# Patient Record
Sex: Male | Born: 1995 | Race: Black or African American | Hispanic: No | Marital: Single | Smoking: Never smoker
Health system: Southern US, Community
[De-identification: ages and names within clinical notes are randomized; demographics above are authoritative.]

---
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
## 2017-08-10 ENCOUNTER — Emergency Department (HOSPITAL_COMMUNITY)
Admission: EM | Admit: 2017-08-10 | Discharge: 2017-08-11 | Disposition: A | Discharge status: Home / Self Care | Payer: Medicaid Other | Attending: Emergency Medicine | Admitting: Emergency Medicine

## 2017-08-10 ENCOUNTER — Emergency Department (HOSPITAL_COMMUNITY): Payer: Medicaid Other

## 2017-08-10 ENCOUNTER — Other Ambulatory Visit: Payer: Self-pay

## 2017-08-10 DIAGNOSIS — F419 Anxiety disorder, unspecified: Secondary | ICD-10-CM | POA: Insufficient documentation

## 2017-08-10 DIAGNOSIS — Z79899 Other long term (current) drug therapy: Secondary | ICD-10-CM | POA: Insufficient documentation

## 2017-08-10 DIAGNOSIS — R0789 Other chest pain: Secondary | ICD-10-CM | POA: Diagnosis not present

## 2017-08-10 DIAGNOSIS — R079 Chest pain, unspecified: Secondary | ICD-10-CM | POA: Diagnosis present

## 2017-08-10 LAB — CBC
HEMATOCRIT: 44.1 % (ref 39.0–52.0)
HEMOGLOBIN: 14.8 g/dL (ref 13.0–17.0)
MCH: 32.5 pg (ref 26.0–34.0)
MCHC: 33.6 g/dL (ref 30.0–36.0)
MCV: 96.9 fL (ref 78.0–100.0)
Platelets: 247 10*3/uL (ref 150–400)
RBC: 4.55 MIL/uL (ref 4.22–5.81)
RDW: 12.3 % (ref 11.5–15.5)
WBC: 9.6 10*3/uL (ref 4.0–10.5)

## 2017-08-10 LAB — URINALYSIS, ROUTINE W REFLEX MICROSCOPIC
Bilirubin Urine: NEGATIVE
GLUCOSE, UA: NEGATIVE mg/dL
Hgb urine dipstick: NEGATIVE
KETONES UR: NEGATIVE mg/dL
LEUKOCYTES UA: NEGATIVE
NITRITE: NEGATIVE
PROTEIN: NEGATIVE mg/dL
Specific Gravity, Urine: 1.028 (ref 1.005–1.030)
pH: 5 (ref 5.0–8.0)

## 2017-08-10 LAB — BASIC METABOLIC PANEL
ANION GAP: 9 (ref 5–15)
BUN: 13 mg/dL (ref 6–20)
CO2: 26 mmol/L (ref 22–32)
Calcium: 9.4 mg/dL (ref 8.9–10.3)
Chloride: 105 mmol/L (ref 101–111)
Creatinine, Ser: 1.26 mg/dL — ABNORMAL HIGH (ref 0.61–1.24)
GLUCOSE: 102 mg/dL — AB (ref 65–99)
Potassium: 3.9 mmol/L (ref 3.5–5.1)
Sodium: 140 mmol/L (ref 135–145)

## 2017-08-10 LAB — I-STAT TROPONIN, ED: TROPONIN I, POC: 0.01 ng/mL (ref 0.00–0.08)

## 2017-08-10 NOTE — ED Triage Notes (Signed)
Patient c/o CP that began one hour ago and states that he believes it may have been a panic attack.

## 2017-08-11 ENCOUNTER — Emergency Department (HOSPITAL_BASED_OUTPATIENT_CLINIC_OR_DEPARTMENT_OTHER): Payer: Medicaid Other

## 2017-08-11 DIAGNOSIS — I361 Nonrheumatic tricuspid (valve) insufficiency: Secondary | ICD-10-CM

## 2017-08-11 LAB — ECHOCARDIOGRAM COMPLETE
Height: 76 in
WEIGHTICAEL: 4816 [oz_av]

## 2017-08-11 LAB — I-STAT TROPONIN, ED: Troponin i, poc: 0 ng/mL (ref 0.00–0.08)

## 2017-08-11 NOTE — Discharge Instructions (Addendum)
It was a pleasure to take care of you Matthew Munoz. During this stay you were taken care of for your chest pain. You did not have a heart attack. Please follow up with your primary care provider within a week

## 2017-08-11 NOTE — ED Notes (Signed)
Patient is resting comfortably. 

## 2017-08-11 NOTE — ED Notes (Signed)
Pt to ECHO with tech

## 2017-08-11 NOTE — Progress Notes (Signed)
  Echocardiogram 2D Echocardiogram has been performed.  Uriel Dowding L Androw 08/11/2017, 10:37 AM

## 2017-08-11 NOTE — ED Notes (Signed)
Pt ambulated to restroom without distress; continues to deny chest pain

## 2017-08-11 NOTE — ED Provider Notes (Addendum)
[facility] EMERGENCY DEPARTMENT Provider Note   CSN: [id number] Arrival date & time: 08/10/17  2000     History   Chief Complaint Chief Complaint  Patient presents with  . Chest Pain    HPI Matthew Munoz is a 22 y.o. male. With mitral regurgitation presents with one episode of chest pain that started yesterday evening around 6:30pm and lasted a few seconds. The patient describes his chest pain to be present over anterior chest, dull in nature, and non-radiating. The patient states that he has accompanied shortness of breath, subjective tachycardia, and a feeling of impending sense of doom. Prior to the episode of chest pain the patient had worked out (lifted [employer]) and subsequently had eaten a meal and was laying on his bed playing a video game. He stays with a roommate in apartment and he asked his roommate to call ambulance.    He denies any sweating, dizziness, nausea/vomiting, or palpitations. The patient states that he felt this way once before a year ago.   The patient's great grandfather and his uncle both had heart disease in their 69s-60s.  No past medical history on file.  There are no active problems to display for this patient.    Home Medications    Prior to Admission medications   Medication Sig Start Date End Date Taking? Authorizing Provider  lisinopril (PRINIVIL,ZESTRIL) 5 MG tablet Take 5 mg by mouth at bedtime.   Yes [provider]    Family History No family history on file.  Social History Social History   Tobacco Use  . Smoking status: Not on file  Substance Use Topics  . Alcohol use: Not on file  . Drug use: Not on file     Allergies   Avocado   Review of Systems Review of Systems  Cardiovascular: Negative for palpitations.  Gastrointestinal: Negative for abdominal pain, nausea and vomiting.     Physical Exam Updated Vital Signs BP 124/88   Pulse (!) 56   Temp 98.4 F (36.9 C) (Oral)   Resp 17   Ht   (1.93 m)   Wt (!) 136.5 kg (301 lb)   SpO2 100%   BMI 36.64 kg/m   Physical Exam  Constitutional: He appears well-developed and well-nourished.  Non-toxic appearance. He does not appear ill.  HENT:  Head: Normocephalic and atraumatic.  Eyes: Pupils are equal, round, and reactive to light.  Cardiovascular: Normal rate and regular rhythm.  Pulmonary/Chest: Effort normal and breath sounds normal. No accessory muscle usage. No respiratory distress.  Abdominal: Soft. Bowel sounds are normal. He exhibits no distension. There is no tenderness.  Musculoskeletal:       Right lower leg: He exhibits no edema.  Neurological: He is alert.  Skin: No erythema.  Psychiatric: He has a normal mood and affect. His behavior is normal. He is not agitated.     ED Treatments / Results  Labs (all labs ordered are listed, but only abnormal results are displayed) Labs Reviewed  BASIC METABOLIC PANEL - Abnormal; Notable for the following components:      Result Value   Glucose, Bld 102 (*)    Creatinine, Ser 1.26 (*)    All other components within normal limits  URINALYSIS, ROUTINE W REFLEX MICROSCOPIC - Abnormal; Notable for the following components:   APPearance HAZY (*)    All other components within normal limits  CBC  I-STAT TROPONIN, ED  I-STAT TROPONIN, ED    EKG EKG Interpretation  Date/Time:  Tuesday Aug 11 2017 08:16:18 EDT Ventricular Rate:  59 PR Interval:  158 QRS Duration: 111 QT Interval:  422 QTC Calculation: 418 R Axis:   29 Text Interpretation:  Sinus rhythm Borderline Q waves in lateral leads Minimal ST depression, inferior leads Borderline ST elevation, anterolateral leads Nonspecific ST and T wave abnormality Nonspecific T wave abnormality Confirmed by Derwood Kaplan [phone]) on 08/11/2017 12:11:58 PM   Radiology Dg Chest 2 View  Result Date: 08/10/2017 CLINICAL DATA:  22 year old male with chest pain EXAM: CHEST - 2 VIEW COMPARISON:  None. FINDINGS: The heart size  and mediastinal contours are within normal limits. Both lungs are clear. The visualized skeletal structures are unremarkable. IMPRESSION: No active cardiopulmonary disease. Electronically Signed   By: Elgie Collard M.D.   On: 08/10/2017 21:35    Procedures Procedures (including critical care time)  Medications Ordered in ED Medications - No data to display   Initial Impression / Assessment and Plan / ED Course  I have reviewed the triage vital signs and the nursing notes.  Pertinent labs & imaging results that were available during my care of the patient were reviewed by me and considered in my medical decision making (see chart for details).  Chest pain The patient had an acute episode of chest pain that lasted a few seconds. EKG showed t wave inversions in V3 and V4, st elevation in V2, and q waves. There was no old EKG to compare. Chest xray without any acute cardiopulmonary changes. The patient is afebrile, without leukocytosis, and any accompanying upper respiratory symptoms to suggest pneumonia. The patient also has normal hemoglobin and hematocrit (14.8 and 44.1 respectively) to suggest iron deficiency anemia. His extremities are symmetric and no other historical factors to consider dvt or pe. Wells score is low.    Patient has a heart score of 1 based on obesity. Called cardiology who reviewed EKG and felt that it was early repolarization. Echocardiogram revealed preserved EF 50-55%, normal wall motion, calcified mitral valve, no pericardial effusion, mildly elevated PA pressure at .  Spoke to cardiologist Dr. Armanda Magic who mentioned that the EKG changes are consistent with early repolarization. Was able to obtain prior ekg from [facility] and ekg changes seem unchanged. Recommended follow up with primary care provider.   The patient may need to be started on long term medication for anxiety.   Final Clinical Impressions(s) / ED Diagnoses   Final diagnoses:    Atypical chest pain  Anxiety    ED Discharge Orders    None       Lorenso Courier, MD 08/11/17 1343    Lorenso Courier, MD 08/11/17 1355    Lorenso Courier, MD 08/11/17 1416    Loren Racer, MD 08/13/17 1650

## 2020-08-31 ENCOUNTER — Other Ambulatory Visit: Payer: Self-pay

## 2020-08-31 ENCOUNTER — Emergency Department (HOSPITAL_COMMUNITY)
Admission: EM | Admit: 2020-08-31 | Discharge: 2020-09-01 | Disposition: A | Discharge status: Other Acute Inpatient Hospital | Payer: Medicaid Other | Attending: Emergency Medicine | Admitting: Emergency Medicine

## 2020-08-31 ENCOUNTER — Encounter (HOSPITAL_COMMUNITY): Payer: Self-pay | Admitting: Emergency Medicine

## 2020-08-31 DIAGNOSIS — Y900 Blood alcohol level of less than 20 mg/100 ml: Secondary | ICD-10-CM | POA: Insufficient documentation

## 2020-08-31 DIAGNOSIS — F22 Delusional disorders: Secondary | ICD-10-CM | POA: Insufficient documentation

## 2020-08-31 DIAGNOSIS — F1210 Cannabis abuse, uncomplicated: Secondary | ICD-10-CM | POA: Insufficient documentation

## 2020-08-31 DIAGNOSIS — Z20822 Contact with and (suspected) exposure to covid-19: Secondary | ICD-10-CM | POA: Insufficient documentation

## 2020-08-31 NOTE — ED Provider Notes (Signed)
Emergency Medicine Provider Triage Evaluation Note  Matthew Munoz , a 25 y.o. male  was evaluated in triage.  Patient was IVC by family.   Per IVC paperwork, patient has a history of mental health, but is paranoid and believes someone is after him and trying to kill him.  He believes family is against him and trying to kill him.  He stripped down and went outside and tried to go to the park and stripped down.  Family endorses previous heavy marijuana use, but he says that he has stopped.  He sits and laughs.  He talks to himself.  He denies HI, SI, or auditory visual hallucinations.  He states that he has been worried about people trying to harm him recently.  He states that he knows all of these individuals, and he thinks that they are trying to harm him over witchcraft spells.  He denies alcohol use, tobacco, marijuana use.  No other illicit or recreational substance use.  No chronic medical conditions or daily medications.  No fever, chills, chest pain, shortness of breath, dysuria, headache.   Review of Systems  Positive: Delusions, erratic behavior Negative: SI, homicidal ideation, auditory or visual hallucinations, chest pain, shortness of breath, abdominal pain, fever, numbness, weakness, headache, dysuria  Physical Exam  BP (!) 148/96 (BP Location: Right Arm)   Pulse 64   Temp 98.3 F (36.8 C) (Oral)   Resp 16   SpO2 100%  Gen:   Awake, no distress    Resp:  Normal effort  MSK:   Moves extremities without difficulty  Other:  Cooperative.  Answers questions appropriately.  Poor judgment.  Medical Decision Making  Medically screening exam initiated at 11:46 PM.  Appropriate orders placed.  Wendi Snipes was informed that the remainder of the evaluation will be completed by another provider, this initial triage assessment does not replace that evaluation, and the importance of remaining in the ED until their evaluation is complete.  25 year old male who presents under IVC.   First exam has not been completed at this time.  Family is concerned for increasing paranoid behavior.  On exam, patient states that known individuals are trying to harm him of her witchcraft spells.  He has no other medical complaints at this time.  Medical screening labs have been ordered.  TTS consult has been placed.  Patient require further work-up and evaluation in the emergency department.   Barkley Boards, PA-C 08/31/20 2349    Shon Baton, MD 09/01/20 [phone]

## 2020-08-31 NOTE — ED Triage Notes (Signed)
Patient arrived with 2 [employer] officers IVC due to paranoid behavior/erratic behavior  , delusions , talks to self with flight of ideas . Denies SI or HI .

## 2020-08-31 NOTE — ED Notes (Signed)
Pt changed into burgundy scrubs and all belongings were placed in a belongings bag. Pt is calm, cooperative, and understanding of the process.

## 2020-09-01 ENCOUNTER — Ambulatory Visit (HOSPITAL_COMMUNITY)
Admission: EM | Admit: 2020-09-01 | Discharge: 2020-09-01 | Disposition: A | Discharge status: Home / Self Care | Payer: No Payment, Other | Attending: Family | Admitting: Family

## 2020-09-01 DIAGNOSIS — F22 Delusional disorders: Secondary | ICD-10-CM | POA: Insufficient documentation

## 2020-09-01 LAB — COMPREHENSIVE METABOLIC PANEL
ALT: 19 U/L (ref 0–44)
AST: 20 U/L (ref 15–41)
Albumin: 4.3 g/dL (ref 3.5–5.0)
Alkaline Phosphatase: 47 U/L (ref 38–126)
Anion gap: 6 (ref 5–15)
BUN: 12 mg/dL (ref 6–20)
CO2: 29 mmol/L (ref 22–32)
Calcium: 9.5 mg/dL (ref 8.9–10.3)
Chloride: 106 mmol/L (ref 98–111)
Creatinine, Ser: 1.23 mg/dL (ref 0.61–1.24)
GFR, Estimated: >60 mL/min (ref >60)
Glucose, Bld: 88 mg/dL (ref 70–99)
Potassium: 4.1 mmol/L (ref 3.5–5.1)
Sodium: 141 mmol/L (ref 135–145)
Total Bilirubin: 1.7 mg/dL — ABNORMAL HIGH (ref 0.3–1.2)
Total Protein: 7 g/dL (ref 6.5–8.1)

## 2020-09-01 LAB — CBC WITH DIFFERENTIAL/PLATELET
Abs Immature Granulocytes: 0.02 10*3/uL (ref 0.00–0.07)
Basophils Absolute: 0.1 10*3/uL (ref 0.0–0.1)
Basophils Relative: 1 %
Eosinophils Absolute: 0.3 10*3/uL (ref 0.0–0.5)
Eosinophils Relative: 4 %
HCT: 49.7 % (ref 39.0–52.0)
Hemoglobin: 16.5 g/dL (ref 13.0–17.0)
Immature Granulocytes: 0 %
Lymphocytes Relative: 32 %
Lymphs Abs: 2.4 10*3/uL (ref 0.7–4.0)
MCH: 32.9 pg (ref 26.0–34.0)
MCHC: 33.2 g/dL (ref 30.0–36.0)
MCV: 99 fL (ref 80.0–100.0)
Monocytes Absolute: 0.5 10*3/uL (ref 0.1–1.0)
Monocytes Relative: 7 %
Neutro Abs: 4.4 10*3/uL (ref 1.7–7.7)
Neutrophils Relative %: 56 %
Platelets: 240 10*3/uL (ref 150–400)
RBC: 5.02 MIL/uL (ref 4.22–5.81)
RDW: 11.4 % — ABNORMAL LOW (ref 11.5–15.5)
WBC: 7.7 10*3/uL (ref 4.0–10.5)
nRBC: 0 % (ref 0.0–0.2)

## 2020-09-01 LAB — RESP PANEL BY RT-PCR (FLU A&B, COVID) ARPGX2
Influenza A by PCR: NEGATIVE
Influenza B by PCR: NEGATIVE
SARS Coronavirus 2 by RT PCR: NEGATIVE

## 2020-09-01 LAB — RAPID URINE DRUG SCREEN, HOSP PERFORMED
Amphetamines: NOT DETECTED
Barbiturates: NOT DETECTED
Benzodiazepines: NOT DETECTED
Cocaine: NOT DETECTED
Opiates: NOT DETECTED
Tetrahydrocannabinol: NOT DETECTED

## 2020-09-01 LAB — ETHANOL: Alcohol, Ethyl (B): <10 mg/dL (ref <10)

## 2020-09-01 LAB — ACETAMINOPHEN LEVEL: Acetaminophen (Tylenol), Serum: <10 ug/mL — ABNORMAL LOW (ref 10–30)

## 2020-09-01 LAB — SALICYLATE LEVEL: Salicylate Lvl: <7 mg/dL — ABNORMAL LOW (ref 7.0–30.0)

## 2020-09-01 MED ORDER — ACETAMINOPHEN 325 MG PO TABS: 650.0000 mg | ORAL_TABLET | Freq: Four times a day (QID) | ORAL | Status: DC | PRN

## 2020-09-01 MED ORDER — ALUM & MAG HYDROXIDE-SIMETH 200-200-20 MG/5ML PO SUSP: 30.0000 mL | ORAL | Status: DC | PRN

## 2020-09-01 MED ORDER — MAGNESIUM HYDROXIDE 400 MG/5ML PO SUSP: 30.0000 mL | Freq: Every day | ORAL | Status: DC | PRN

## 2020-09-01 NOTE — ED Provider Notes (Signed)
[facility] Medical Screening Exam  Patient Name: Matthew Munoz MRN: [id number] Date of Evaluation: 09/01/20 Chief Complaint:   Diagnosis:  Final diagnoses:  Paranoia (HCC)    History of Present illness: Matthew Munoz is a 25 y.o. male was place under involuntary commitment for reported paranoia ideations.  NP spoke to patient's mother with [name] about reported symptoms.  She reports patient has be desplying bizarre behavior and more recently his paranoia is more pronounced. Reported patient has been hiding in the closet thinking people are after him.stated a family history with mental illiness  reports patient's sister recently diagnosed with schizoaffective/schizophrenia.  Maternal grandmother has mental health diagnoses.   On evaluation.  Patient is denying paranoia, delusions or auditory hallucinations.  Denies suicidal or homicidal ideations.  Chart reviewed negative UDS.  Discussed initiating antipsychotics.  Patient declined and is requesting to discharge.  Case staffed with attending psychiatrist Akintayo.  Will rescind involuntary commitment.  Support, encouragement and reassurance was provided.   Per admission assessment note: Matthew Munoz , a 25 y.o. male  was evaluated in triage.  Patient was IVC by family.   Per IVC paperwork, patient has a history of mental health, but is paranoid and believes someone is after him and trying to kill him.  He believes family is against him and trying to kill him.  He stripped down and went outside and tried to go to the park and stripped down.  Family endorses previous heavy marijuana use, but he says that he has stopped.  He sits and laughs.  He talks to himself.  Psychiatric Specialty Exam  Presentation  General Appearance:Appropriate for Environment  Eye Contact:Good  Speech:Clear and Coherent  Speech Volume:Normal  Handedness:Right   Mood and Affect  Mood:Irritable  Affect:Congruent   Thought  Process  Thought Processes:Coherent  Descriptions of Associations:Intact  Orientation:Full (Time, Place and Person)  Thought Content:Logical  Diagnosis of Schizophrenia or Schizoaffective disorder in past: No  Duration of Psychotic Symptoms: Less than six months  Hallucinations:None  Ideas of Reference:None  Suicidal Thoughts:No  Homicidal Thoughts:No   Sensorium  Memory:Immediate Fair; Recent Fair  Judgment:Fair  Insight:Good   Executive Functions  Concentration:Fair  Attention Span:Fair  Recall:Good  Fund of Knowledge:Good  Language:Good   Psychomotor Activity  Psychomotor Activity:Normal   Assets  Assets:Intimacy; Financial Resources/Insurance; Social Support   Sleep  Sleep:Fair  Number of hours: No data recorded  Nutritional Assessment (For [facility] and [facility] admissions only) Has the patient had a weight loss or gain of 10 pounds or more in the last 3 months?: No Has the patient had a decrease in food intake/or appetite?: No Does the patient have dental problems?: No Does the patient have eating habits or behaviors that may be indicators of an eating disorder including binging or inducing vomiting?: No Has the patient recently lost weight without trying?: No Has the patient been eating poorly because of a decreased appetite?: No Malnutrition Screening Tool Score: 0    Physical Exam: Physical Exam Cardiovascular:     Rate and Rhythm: Normal rate and regular rhythm.  Neurological:     Mental Status: He is alert.  Psychiatric:        Mood and Affect: Mood normal.        Thought Content: Thought content normal.    Review of Systems  Psychiatric/Behavioral: Negative for depression and suicidal ideas. The patient is not nervous/anxious.   All other systems reviewed and are negative.  Blood pressure  133/89, pulse (!) 50, SpO2 100 %. There is no height or weight on file to calculate BMI.  Musculoskeletal: Strength & Muscle Tone: within normal  limits Gait & Station: normal Patient leans: N/A   [facility] MSE Discharge Disposition for Follow up and Recommendations: Based on my evaluation the patient does not appear to have an emergency medical condition and can be discharged with resources and follow up care in outpatient services for Medication Management, Partial Hospitalization Program and Individual Therapy   Oneta Rack, NP 09/01/2020, 12:20 PM

## 2020-09-01 NOTE — Progress Notes (Signed)
Patient oriented to [facility] unit, offered food and toileting, denies SI, HI and AVH. No skin issues present on body. Patient minimally interactive/ cautious when speaking, answering yes or no question appropriately. Nursing staff will continue to monitor.

## 2020-09-01 NOTE — Progress Notes (Signed)
Patient told, mom would be here in an hour to pick him up. Patient upset about waiting for an hour.

## 2020-09-01 NOTE — ED Notes (Signed)
Pt belongings in locker 2 and also with security

## 2020-09-01 NOTE — Progress Notes (Signed)
Patient received after visit summary with follow up instructions. Patient escorted to the lobby where he can wait for his mother or walk home as he wanted.

## 2020-09-01 NOTE — BH Assessment (Signed)
Comprehensive Clinical Assessment (CCA) Note  09/01/2020 Matthew Munoz [id number] Recommendations for Services/Supports/Treatments: Consulted with Ysidro Evert B., NP who recommended pt. be observed at [facility] and reassessed in the AM. Notified pt's nurse Bonita Quin of disposition.   Pt presented with a casual appearance. Pt's speech was clear and coherent. Motor behavior appears unremarkable. Pt was alert and oriented x4. Pt's thought content was relevant to the context of the situation. Eye contact was poor. Pt's mood is labile, affect is congruent with mood. The pt was initially calm. It was noted that the pt. became slightly agitated when asked if he was suicidal or hearing voices. Towards the end of the assessment the pt. was smiling inappropriately. Patient was noted to have poor insight and was unable to identify any services needed. Pt admitted to feeling like someone was after him prior to coming to the ED; however, pt denied currently feeling that way. Pt reports that his main stressors are not having enough money and feeling like someone was after him. Pt denies substance use. The patient denied SI, HI, AV/hallucinations. Pt was guarded and expressed that the best thing to help him would be to discharge. Flowsheet Row ED from 08/31/2020 in [facility] EMERGENCY DEPARTMENT  C-SSRS RISK CATEGORY No Risk    The patient demonstrates the following risk factors for suicide: Chronic risk factors for suicide include: N/A. Acute risk factors for suicide include: N/A. Protective factors for this patient include: hope for the future. Considering these factors, the overall suicide risk at this point appears to be no risk. Patient is not appropriate for outpatient follow up.  Therefore; pt is not recommended for 1:1 suicide precautions.  Chief Complaint:  Chief Complaint  Patient presents with  . IVC ( Paranoid/Delusions)    Visit Diagnosis: Psych evaluation    CCA Screening, Triage and  Referral (STR)  Patient Reported Information How did you hear about [place]? Family/Friend  Referral name: No data recorded Referral phone number: No data recorded  Whom do you see for routine medical problems? No data recorded Practice/Facility Name: No data recorded Practice/Facility Phone Number: No data recorded Name of Contact: No data recorded Contact Number: No data recorded Contact Fax Number: No data recorded Prescriber Name: No data recorded Prescriber Address (if known): No data recorded  What Is the Reason for Your Visit/Call Today? Paranoid Delusions  How Long Has This Been Causing You Problems? 1 wk - 1 month  What Do You Feel Would Help You the Most Today? -- (Pt unable to identify needs at this time)   Have You Recently Been in Any Inpatient Treatment (Hospital/Detox/Crisis Center/28-Day Program)? No data recorded Name/Location of Program/Hospital:No data recorded How Long Were You There? No data recorded When Were You Discharged? No data recorded  Have You Ever Received Services From [facility] Before? No data recorded Who Do You See at [facility]? No data recorded  Have You Recently Had Any Thoughts About Hurting Yourself? No  Are You Planning to Commit Suicide/Harm Yourself At This time? No   Have you Recently Had Thoughts About Hurting Someone Karolee Ohs? No  Explanation: No data recorded  Have You Used Any Alcohol or Drugs in the Past 24 Hours? No  How Long Ago Did You Use Drugs or Alcohol? No data recorded What Did You Use and How Much? No data recorded  Do You Currently Have a Therapist/Psychiatrist? No  Name of Therapist/Psychiatrist: No data recorded  Have You Been Recently Discharged From Any Office Practice  or Programs? No  Explanation of Discharge From Practice/Program: No data recorded    CCA Screening Triage Referral Assessment Type of Contact: Tele-Assessment  Is this Initial or Reassessment? Initial Assessment  Date Telepsych consult  ordered in [facility]:  08/31/2020  Time Telepsych consult ordered in [facility]:  2346   Patient Reported Information Reviewed? Yes  Patient Left Without Being Seen? No data recorded Reason for Not Completing Assessment: No data recorded  Collateral Involvement: No data recorded  Does Patient Have a Court Appointed Legal Guardian? No data recorded Name and Contact of Legal Guardian: No data recorded If Minor and Not Living with Parent(s), Who has Custody? No data recorded Is CPS involved or ever been involved? Never  Is APS involved or ever been involved? Never   Patient Determined To Be At Risk for Harm To Self or Others Based on Review of Patient Reported Information or Presenting Complaint? No  Method: No data recorded Availability of Means: No data recorded Intent: No data recorded Notification Required: No data recorded Additional Information for Danger to Others Potential: No data recorded Additional Comments for Danger to Others Potential: No data recorded Are There Guns or Other Weapons in Your Home? No data recorded Types of Guns/Weapons: No data recorded Are These Weapons Safely Secured?                            No data recorded Who Could Verify You Are Able To Have These Secured: No data recorded Do You Have any Outstanding Charges, Pending Court Dates, Parole/Probation? No data recorded Contacted To Inform of Risk of Harm To Self or Others: No data recorded  Location of Assessment: [facility] ED   Does Patient Present under Involuntary Commitment? Yes  IVC Papers Initial File Date: 08/31/2020   [place] of Residence: [place]   Patient Currently Receiving the Following Services: No data recorded  Determination of Need: Urgent (48 hours)   Options For Referral: -- (Per Ysidro Evert, NP pt is recommended for observation and reassessment at [facility].)     CCA Biopsychosocial Intake/Chief Complaint:  IVC; paranoid delusions, responding to internal stimuli  Current Symptoms/Problems:  "I was mad. I felt like people was after me"   Patient Reported Schizophrenia/Schizoaffective Diagnosis in Past: No   Strengths: Hope  Preferences: None noted  Abilities: Attends to activities of daily living   Type of Services Patient Feels are Needed: Pt unable to identify any services needed.   Initial Clinical Notes/Concerns: No data recorded  Mental Health Symptoms Depression:  None   Duration of Depressive symptoms: No data recorded  Mania:  None   Anxiety:   None   Psychosis:  Delusions   Duration of Psychotic symptoms: Less than six months   Trauma:  None   Obsessions:  None   Compulsions:  None   Inattention:  None   Hyperactivity/Impulsivity:  N/A   Oppositional/Defiant Behaviors:  None   Emotional Irregularity:  None   Other Mood/Personality Symptoms:  No data recorded   Mental Status Exam Appearance and self-care  Stature:  Average   Weight:  Average weight   Clothing:  Casual   Grooming:  Normal   Cosmetic use:  None   Posture/gait:  Normal   Motor activity:  Not Remarkable   Sensorium  Attention:  Normal   Concentration:  No data recorded  Orientation:  No data recorded  Recall/memory:  Normal   Affect and Mood  Affect:  Labile  Mood:  Irritable   Relating  Eye contact:  None   Facial expression:  Responsive   Attitude toward examiner:  Guarded   Thought and Language  Speech flow: Clear and Coherent   Thought content:  Appropriate to Mood and Circumstances   Preoccupation:  None   Hallucinations:  None   Organization:  No data recorded  [employer] of Knowledge:  Average   Intelligence:  Average   Abstraction:  Normal   Judgement:  Impaired   Reality Testing:  Distorted   Insight:  Poor   Decision Making:  Vacilates   Social Functioning  Social Maturity:  Isolates   Social Judgement:  Impropriety   Stress  Stressors:  Surveyor, quantity; Family conflict   Coping Ability:  Deficient  supports   Skill Deficits:  Decision making   Supports:  Support needed     Religion: Religion/Spirituality Are You A Religious Person?: No  Leisure/Recreation:    Exercise/Diet: Exercise/Diet Have You Gained or Lost A Significant Amount of Weight in the Past Six Months?: No Do You Follow a Special Diet?: No Do You Have Any Trouble Sleeping?: No   CCA Employment/Education Employment/Work Situation: Employment / Work Situation Employment situation: Employed 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 Patient's job has been impacted by current illness: No Has patient ever been in the [employer]?: No  Education: Education Is Patient Currently Attending School?: No Last Grade Completed: 12 Did Garment/textile technologist From [employer]?: Yes Did Theme park manager?: No   CCA Family/Childhood History Family and Relationship History: Family history Marital status: Single Are you sexually active?: Yes What is your sexual orientation?: Heterosexual Does patient have children?: No  Childhood History:  Childhood History By whom was/is the patient raised?: Both parents,Grandparents Additional childhood history information: Pt describes childhood as fair Description of patient's relationship with caregiver when they were a child: Pt reported having a strained relationship with his mother growin up. Patient's description of current relationship with people who raised him/her: Pt explained that the relationship is still strained. Does patient have siblings?: Yes Number of Siblings: 7 Description of patient's current relationship with siblings: "My family is fucked up" Did patient suffer any verbal/emotional/physical/sexual abuse as a child?: No Did patient suffer from severe childhood neglect?: No Has patient ever been sexually abused/assaulted/raped as an adolescent or adult?: No Was the patient ever a victim of a crime or a disaster?: No Witnessed domestic violence?:  No Has patient been affected by domestic violence as an adult?: No  Child/Adolescent Assessment:     CCA Substance Use Alcohol/Drug Use: Alcohol / Drug Use Pain Medications: See MAR Prescriptions: See MAR Over the Counter: See MAR History of alcohol / drug use?: No history of alcohol / drug abuse                        Patient Centered Plan: [employer], LCAS

## 2020-09-01 NOTE — ED Notes (Signed)
Report given to [facility] at this time. Report given to [facility]. [employer] notified to transport pt to [facility] ASAP. Waiting for [employer].

## 2020-09-01 NOTE — BH Assessment (Signed)
This Clinical research associate notified pt's nurse Bonita Quin that pt has been accepted to [facility] (Pt can be transported after 7:30 AM; after shift change).

## 2020-09-01 NOTE — ED Notes (Signed)
OFFERED LUNCH. REFUSED

## 2020-09-01 NOTE — Discharge Instructions (Addendum)
Take all medications as prescribed. Keep all follow-up appointments as scheduled.  Do not consume alcohol or use illegal drugs while on prescription medications. Report any adverse effects from your medications to your primary care provider promptly.  In the event of recurrent symptoms or worsening symptoms, call 911, a crisis hotline, or go to the nearest emergency department for evaluation.   

## 2020-09-01 NOTE — ED Notes (Signed)
Pharmacy tech at bedside 

## 2020-09-01 NOTE — ED Notes (Signed)
TTS machine is at bedside with pt at this time

## 2020-09-01 NOTE — ED Provider Notes (Signed)
Patient has been accepted to [facility], IVC in place. Patient will be transported by [employer]. Stable at time of discharge.   Rozelle Logan, DO 09/01/20 2956

## 2020-09-01 NOTE — ED Notes (Signed)
Pt locked belongings and 1 valuable locked in security given to [employer]. 2 [employer] escorted pt to [facility] at this time.

## 2020-09-01 NOTE — ED Notes (Signed)
Attempted to get pt to sign consent for transfer and pt refused

## 2020-09-03 ENCOUNTER — Telehealth (HOSPITAL_COMMUNITY): Payer: Self-pay

## 2020-09-03 NOTE — BH Assessment (Signed)
Care Management - Follow Up [facility] Discharges   Writer attempted to make contact with patient today and was unsuccessful.  Patient voicemail was full.

## 2020-09-25 NOTE — ED Provider Notes (Signed)
[facility] EMERGENCY DEPARTMENT Provider Note   CSN: [id number] Arrival date & time: 08/31/20  2247     History Chief Complaint  Patient presents with  . IVC ( Paranoid/Delusions)     Matthew Munoz is a 25 y.o. male who presents to the emergency department under IVC.  Patient was IVC by family.  Per IVC paperwork, "patient has a history of mental health, but is paranoid and believes someone is after him and trying to kill him.  He believes family is against him and trying to kill him.  He stripped down and went outside and tried to go to the park and stripped down.  Family endorses previous heavy marijuana use, but he says that he has stopped.  He sits and laughs.  He talks to himself."   He denies HI, SI, or auditory visual hallucinations.  He states that he has been worried about people trying to harm him recently.  He states that he knows all of these individuals, and he thinks that they are trying to harm him over witchcraft spells.   He denies alcohol use, tobacco, marijuana use.  No other illicit or recreational substance use.   No chronic medical conditions or daily medications.  No fever, chills, chest pain, shortness of breath, dysuria, headache, or other associated symptoms.  The history is provided by the patient and medical records. No language interpreter was used.      History reviewed. No pertinent past medical history.  There are no problems to display for this patient.   History reviewed. No pertinent surgical history.     No family history on file.  Social History   Tobacco Use  . Smoking status: Never  . Smokeless tobacco: Never  Substance Use Topics  . Alcohol use: Never  . Drug use: Never    Home Medications Prior to Admission medications   Not on File    Allergies    Avocado  Review of Systems   Review of Systems  Constitutional:  Negative for appetite change, chills, diaphoresis and fever.  HENT:  Negative for  congestion and sore throat.   Respiratory:  Negative for shortness of breath.   Cardiovascular:  Negative for chest pain.  Gastrointestinal:  Negative for abdominal pain, diarrhea, nausea and vomiting.  Genitourinary:  Negative for dysuria.  Musculoskeletal:  Negative for back pain, myalgias and neck pain.  Skin:  Negative for rash and wound.  Allergic/Immunologic: Negative for immunocompromised state.  Neurological:  Negative for syncope, weakness, numbness and headaches.  Psychiatric/Behavioral:  Positive for behavioral problems, hallucinations and suicidal ideas. Negative for confusion.    Physical Exam Updated Vital Signs BP 125/74 (BP Location: Right Arm)   Pulse 75   Temp 98 F (36.7 C) (Oral)   Resp 15   Ht 6\' 4"  (1.93 m)   Wt 95.3 kg   SpO2 100%   BMI 25.56 kg/m   Physical Exam Vitals and nursing note reviewed.  Constitutional:      General: He is not in acute distress.    Appearance: He is well-developed. He is not ill-appearing, toxic-appearing or diaphoretic.  HENT:     Head: Normocephalic.  Eyes:     Conjunctiva/sclera: Conjunctivae normal.  Cardiovascular:     Rate and Rhythm: Normal rate and regular rhythm.     Pulses: Normal pulses.     Heart sounds: Normal heart sounds. No murmur heard.   No friction rub. No gallop.  Pulmonary:  Effort: Pulmonary effort is normal. No respiratory distress.     Breath sounds: No stridor. No wheezing, rhonchi or rales.  Chest:     Chest wall: No tenderness.  Abdominal:     General: There is no distension.     Palpations: Abdomen is soft. There is no mass.     Tenderness: There is no abdominal tenderness. There is no right CVA tenderness, left CVA tenderness, guarding or rebound.     Hernia: No hernia is present.  Musculoskeletal:     Cervical back: Neck supple.     Right lower leg: No edema.     Left lower leg: No edema.  Skin:    General: Skin is warm and dry.     Coloration: Skin is not jaundiced or pale.      Findings: No erythema.  Neurological:     Mental Status: He is alert.  Psychiatric:        Behavior: Behavior normal.        Thought Content: Thought content is paranoid.    ED Results / Procedures / Treatments   Labs (all labs ordered are listed, but only abnormal results are displayed) Labs Reviewed  COMPREHENSIVE METABOLIC PANEL - Abnormal; Notable for the following components:      Result Value   Total Bilirubin 1.7 (*)    All other components within normal limits  CBC WITH DIFFERENTIAL/PLATELET - Abnormal; Notable for the following components:   RDW 11.4 (*)    All other components within normal limits  ACETAMINOPHEN LEVEL - Abnormal; Notable for the following components:   Acetaminophen (Tylenol), Serum <10 (*)    All other components within normal limits  SALICYLATE LEVEL - Abnormal; Notable for the following components:   Salicylate Lvl <7.0 (*)    All other components within normal limits  RESP PANEL BY RT-PCR (FLU A&B, COVID) [id number]  ETHANOL  RAPID URINE DRUG SCREEN, HOSP PERFORMED    EKG None  Radiology No results found.  Procedures Procedures   Medications Ordered in ED Medications - No data to display  ED Course  I have reviewed the triage vital signs and the nursing notes.  Pertinent labs & imaging results that were available during my care of the patient were reviewed by me and considered in my medical decision making (see chart for details).    MDM Rules/Calculators/A&P                          25 year old male presenting under IVC by family for erratic behavior and paranoia.  Vital signs are stable.  Physical exam is overall reassuring.  Patient was IVC by family.   On exam, patient appears very paranoid.  Labs have been reviewed and independently interpreted by me.  Labs are overall reassuring.  Pt medically cleared at this time. Psych hold orders and home med orders placed. TTS consult pending; please see psych team notes for further  documentation of care/dispo. Pt stable at time of med clearance.     Final Clinical Impression(s) / ED Diagnoses Final diagnoses:  None    Rx / DC Orders ED Discharge Orders     None        Barkley Boards, PA-C 09/25/20 0272    Shon Baton, MD 09/25/20 0700

## 2020-11-07 ENCOUNTER — Emergency Department (HOSPITAL_COMMUNITY)
Admission: EM | Admit: 2020-11-07 | Discharge: 2020-11-08 | Disposition: A | Discharge status: Home / Self Care | Payer: Self-pay | Attending: Emergency Medicine | Admitting: Emergency Medicine

## 2020-11-07 ENCOUNTER — Other Ambulatory Visit: Payer: Self-pay

## 2020-11-07 DIAGNOSIS — F29 Unspecified psychosis not due to a substance or known physiological condition: Secondary | ICD-10-CM | POA: Insufficient documentation

## 2020-11-07 DIAGNOSIS — F22 Delusional disorders: Secondary | ICD-10-CM | POA: Insufficient documentation

## 2020-11-07 DIAGNOSIS — Y900 Blood alcohol level of less than 20 mg/100 ml: Secondary | ICD-10-CM | POA: Insufficient documentation

## 2020-11-07 LAB — COMPREHENSIVE METABOLIC PANEL
ALT: 27 U/L (ref 0–44)
AST: 25 U/L (ref 15–41)
Albumin: 4.3 g/dL (ref 3.5–5.0)
Alkaline Phosphatase: 44 U/L (ref 38–126)
Anion gap: 9 (ref 5–15)
BUN: 9 mg/dL (ref 6–20)
CO2: 28 mmol/L (ref 22–32)
Calcium: 9.6 mg/dL (ref 8.9–10.3)
Chloride: 105 mmol/L (ref 98–111)
Creatinine, Ser: 1.15 mg/dL (ref 0.61–1.24)
GFR, Estimated: >60 mL/min (ref >60)
Glucose, Bld: 92 mg/dL (ref 70–99)
Potassium: 4 mmol/L (ref 3.5–5.1)
Sodium: 142 mmol/L (ref 135–145)
Total Bilirubin: 2 mg/dL — ABNORMAL HIGH (ref 0.3–1.2)
Total Protein: 6.9 g/dL (ref 6.5–8.1)

## 2020-11-07 LAB — CBC
HCT: 39.6 % (ref 39.0–52.0)
Hemoglobin: 12.9 g/dL — ABNORMAL LOW (ref 13.0–17.0)
MCH: 32.6 pg (ref 26.0–34.0)
MCHC: 32.6 g/dL (ref 30.0–36.0)
MCV: 100 fL (ref 80.0–100.0)
Platelets: 199 10*3/uL (ref 150–400)
RBC: 3.96 MIL/uL — ABNORMAL LOW (ref 4.22–5.81)
RDW: 12.3 % (ref 11.5–15.5)
WBC: 6.7 10*3/uL (ref 4.0–10.5)
nRBC: 0 % (ref 0.0–0.2)

## 2020-11-07 NOTE — ED Provider Notes (Signed)
[facility]-EMERGENCY DEPT Provider Note   CSN: [id number] Arrival date & time: 11/07/20  2231     History Chief Complaint  Patient presents with   IVC    Matthew Munoz is a 25 y.o. male Matthew Munoz to the emergency department under IVC.  Patient reports he feels well and knows that he was IVC but does not know why.  Does report that this happened 1 time prior and he was hospitalized for some time but does not remember why.  IVC taken out by friend states: " Respondent is a male.  No known history of mental health issues.  According to friend defendant minimally.  Her home today.  He stated that he was seeing and hearing demons.  Advised that he was the Messiah.  He then began taking off his close doctor.  Undershorts.  Respondent was standing in the middle of the street.  Petitioner advised that respondent has never acted in this manner before.  Law Enforcement responded."  The history is provided by the patient and medical records. No language interpreter was used.      No past medical history on file.  There are no problems to display for this patient.   No past surgical history on file.     No family history on file.  Social History   Tobacco Use   Smoking status: Never   Smokeless tobacco: Never  Substance Use Topics   Alcohol use: Never   Drug use: Never    Home Medications Prior to Admission medications   Not on File    Allergies    Avocado  Review of Systems   Review of Systems  Constitutional:  Negative for appetite change, diaphoresis, fatigue, fever and unexpected weight change.  HENT:  Negative for mouth sores.   Eyes:  Negative for visual disturbance.  Respiratory:  Negative for cough, chest tightness, shortness of breath and wheezing.   Cardiovascular:  Negative for chest pain.  Gastrointestinal:  Negative for abdominal pain, constipation, diarrhea, nausea and vomiting.  Endocrine: Negative for polydipsia, polyphagia and polyuria.   Genitourinary:  Negative for dysuria, frequency, hematuria and urgency.  Musculoskeletal:  Negative for back pain and neck stiffness.  Skin:  Negative for rash.  Allergic/Immunologic: Negative for immunocompromised state.  Neurological:  Negative for syncope, light-headedness and headaches.  Hematological:  Does not bruise/bleed easily.  Psychiatric/Behavioral:  Negative for sleep disturbance. The patient is not nervous/anxious.    Physical Exam Updated Vital Signs BP 132/85   Pulse 64   Temp 98.7 F (37.1 C) (Oral)   Resp 18   SpO2 100%   Physical Exam Vitals and nursing note reviewed.  Constitutional:      General: He is not in acute distress.    Appearance: He is well-developed. He is not ill-appearing.  HENT:     Head: Normocephalic.  Eyes:     General: No scleral icterus.    Conjunctiva/sclera: Conjunctivae normal.  Cardiovascular:     Rate and Rhythm: Normal rate.  Pulmonary:     Effort: Pulmonary effort is normal.  Abdominal:     General: There is no distension.     Tenderness: There is no abdominal tenderness.  Musculoskeletal:        General: Normal range of motion.     Cervical back: Normal range of motion.  Skin:    General: Skin is warm and dry.  Neurological:     General: No focal deficit present.  Mental Status: He is alert and oriented to person, place, and time.  Psychiatric:        Mood and Affect: Mood normal.    ED Results / Procedures / Treatments   Labs (all labs ordered are listed, but only abnormal results are displayed) Labs Reviewed  COMPREHENSIVE METABOLIC PANEL - Abnormal; Notable for the following components:      Result Value   Total Bilirubin 2.0 (*)    All other components within normal limits  SALICYLATE LEVEL - Abnormal; Notable for the following components:   Salicylate Lvl <7.0 (*)    All other components within normal limits  ACETAMINOPHEN LEVEL - Abnormal; Notable for the following components:   Acetaminophen  (Tylenol), Serum <10 (*)    All other components within normal limits  CBC - Abnormal; Notable for the following components:   RBC 3.96 (*)    Hemoglobin 12.9 (*)    All other components within normal limits  ETHANOL  RAPID URINE DRUG SCREEN, HOSP PERFORMED    Procedures Procedures   Medications Ordered in ED Medications - No data to display  ED Course  I have reviewed the triage vital signs and the nursing notes.  Pertinent labs & imaging results that were available during my care of the patient were reviewed by me and considered in my medical decision making (see chart for details).  Clinical Course as of 11/08/20 0622  Thu Nov 08, 2020  0448 Hemoglobin(!): 12.9 Mild anemia [HM]    Clinical Course User Index [HM] Kendrell Lottman, Boyd Kerbs   MDM Rules/Calculators/A&P                           Patient presents under IVC.  We will have TTS evaluate.  Medical clearance labs pending.  12:43 AM Labs are reassuring.  Awaiting TTS evaluation.  6:22 AM First Exam completed.  Patient resting comfortably.  Dispo per TTS.   Final Clinical Impression(s) / ED Diagnoses Final diagnoses:  Psychosis, unspecified psychosis type [facility])    Rx / DC Orders ED Discharge Orders     None        Ilian Wessell, Boyd Kerbs 11/08/20 Sharon Seller, MD 11/09/20 1124

## 2020-11-07 NOTE — ED Triage Notes (Signed)
Per report pt was hallucinating. Pt denies SI/HI. Patient IVC'd by family.  Pt a/ox4.

## 2020-11-08 ENCOUNTER — Emergency Department (HOSPITAL_COMMUNITY): Payer: Self-pay

## 2020-11-08 ENCOUNTER — Inpatient Hospital Stay (HOSPITAL_COMMUNITY)
Admission: AD | Admit: 2020-11-08 | Payer: Federal, State, Local not specified - Other | Source: Intra-hospital | Admitting: Emergency Medicine

## 2020-11-08 DIAGNOSIS — F22 Delusional disorders: Secondary | ICD-10-CM

## 2020-11-08 LAB — RAPID URINE DRUG SCREEN, HOSP PERFORMED
Amphetamines: NOT DETECTED
Barbiturates: NOT DETECTED
Benzodiazepines: NOT DETECTED
Cocaine: NOT DETECTED
Opiates: NOT DETECTED
Tetrahydrocannabinol: NOT DETECTED

## 2020-11-08 LAB — SALICYLATE LEVEL: Salicylate Lvl: <7 mg/dL — ABNORMAL LOW (ref 7.0–30.0)

## 2020-11-08 LAB — ETHANOL: Alcohol, Ethyl (B): <10 mg/dL (ref <10)

## 2020-11-08 LAB — ACETAMINOPHEN LEVEL: Acetaminophen (Tylenol), Serum: <10 ug/mL — ABNORMAL LOW (ref 10–30)

## 2020-11-08 NOTE — BH Assessment (Addendum)
At [phone], clinician messaged Matthew Arsenio Katz, RN "Hey it's Matthew Munoz with TTS. I can see the pt if he's placed in a private room. Also can you fax me his IVC paperwork. [phone]."  Clinician asked RN to fax pt's IVC paperwork. IVC paperwork was received at 0459.  Clinician asked RN to let her know when she can call in to assess the pt.   At 0515 and 0518, clinician called the cart and received an error message: "cannot connect call."    Clinician messaged RN that the cart was not working and to try to restart the cart.   Redmond Pulling, MS, [facility], [facility] Triage Specialist [phone]

## 2020-11-08 NOTE — Consult Note (Signed)
[facility] ED Progress Note  11/08/2020 1:13 PM Matthew Munoz  MRN:  [id number]   Method of visit?: Face to Face   Subjective: per admit note:  IVC taken out by friend states: " Respondent is a male.  No known history of mental health issues.  According to friend defendant minimally.  Her home today.  He stated that he was seeing and hearing demons.  Advised that he was the Messiah.  He then began taking off his close doctor.  Undershorts.  Respondent was standing in the middle of the street.  Petitioner advised that respondent has never acted in this manner before.  Law Enforcement responded."  Today, Matthew Munoz was seen face to face by this provider at [facility] ED. Patient reports that he got on the bus to go visit his lady friend and to talk to her, he left her house and then returned to convince her to pack her clothes and leave with him to live together. Friend was apprehensive about this decision and friend's brother was also at the house and the patient and friend's brother has an argument about the friend leaving the house with this patient. Patient then reports that he took off his clothes, except for pants and started to walk in the street (he was told to get off the property). He does not know why he was IVC'd.   Patient denies suicidal ideation, denies homicidal ideation, denies auditory and visual hallucinations. Denies substance and alcohol use. 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 Denies having a job, even though he reports having graduated from college. He is also homeless.   Reports he is the Messiah and he is the only one who can open the Tree of [employer] book. Reports no one else can open this book.    Principal Problem: Delusional disorder (HCC) Diagnosis:  Principal Problem:   Delusional disorder (HCC)  Total Time spent with patient: 30 minutes  Past Psychiatric History: none  Past Medical History: No past medical history on file. No past surgical history on file. Family  History: No family history on file. Family Psychiatric  History: unknown Social History:  Social History   Substance and Sexual Activity  Alcohol Use Never     Social History   Substance and Sexual Activity  Drug Use Never    Social History   Socioeconomic History   Marital status: Single    Spouse name: Not on file   Number of children: Not on file   Years of education: Not on file   Highest education level: Not on file  Occupational History   Not on file  Tobacco Use   Smoking status: Never   Smokeless tobacco: Never  Substance and Sexual Activity   Alcohol use: Never   Drug use: Never   Sexual activity: Not on file  Other Topics Concern   Not on file  Social History Narrative   Not on file   Social Determinants of Health   Financial Resource Strain: Not on file  Food Insecurity: Not on file  Transportation Needs: Not on file  Physical Activity: Not on file  Stress: Not on file  Social Connections: Not on file    Sleep: Good  Appetite:  Good  Current Medications: No current facility-administered medications for this encounter.   No current outpatient medications on file.    Lab Results:  Results for orders placed or performed during the hospital encounter of 11/07/20 (from the past 48 hour(s))  Comprehensive  metabolic panel     Status: Abnormal   Collection Time: 11/07/20 11:07 PM  Result Value Ref Range   Sodium 142 135 - 145 mmol/L   Potassium 4.0 3.5 - 5.1 mmol/L   Chloride 105 98 - 111 mmol/L   CO2 28 22 - 32 mmol/L   Glucose, Bld 92 70 - 99 mg/dL    Comment: Glucose reference range applies only to samples taken after fasting for at least 8 hours.   BUN 9 6 - 20 mg/dL   Creatinine, Ser 4.091.15 0.61 - 1.24 mg/dL   Calcium 9.6 8.9 - 81.110.3 mg/dL   Total Protein 6.9 6.5 - 8.1 g/dL   Albumin 4.3 3.5 - 5.0 g/dL   AST 25 15 - 41 U/L   ALT 27 0 - 44 U/L   Alkaline Phosphatase 44 38 - 126 U/L   Total Bilirubin 2.0 (H) 0.3 - 1.2 mg/dL   GFR,  Estimated >91>60 >47>60 mL/min    Comment: (NOTE) Calculated using the CKD-EPI Creatinine Equation (2021)    Anion gap 9 5 - 15    Comment: Performed at [facility], [address] [place], [place] [address]  Ethanol     Status: None   Collection Time: 11/07/20 11:07 PM  Result Value Ref Range   Alcohol, Ethyl (B) <10 <10 mg/dL    Comment: (NOTE) Lowest detectable limit for serum alcohol is 10 mg/dL.  For medical purposes only. Performed at [facility], [address] [place], [place] [address]   Salicylate level     Status: Abnormal   Collection Time: 11/07/20 11:07 PM  Result Value Ref Range   Salicylate Lvl <7.0 (L) 7.0 - 30.0 mg/dL    Comment: Performed at [facility], [address] [place], [place] [address]  Acetaminophen level     Status: Abnormal   Collection Time: 11/07/20 11:07 PM  Result Value Ref Range   Acetaminophen (Tylenol), Serum <10 (L) 10 - 30 ug/mL    Comment: (NOTE) Therapeutic concentrations vary significantly. A range of 10-30 ug/mL  may be an effective concentration for many patients. However, some  are best treated at concentrations outside of this range. Acetaminophen concentrations >150 ug/mL at 4 hours after ingestion  and >50 ug/mL at 12 hours after ingestion are often associated with  toxic reactions.  Performed at [facility], [address] [place], [place] [address]   cbc     Status: Abnormal   Collection Time: 11/07/20 11:07 PM  Result Value Ref Range   WBC 6.7 4.0 - 10.5 K/uL   RBC 3.96 (L) 4.22 - 5.81 MIL/uL   Hemoglobin 12.9 (L) 13.0 - 17.0 g/dL   HCT 52.839.6 41.339.0 - 24.452.0 %   MCV 100.0 80.0 - 100.0 fL   MCH 32.6 26.0 - 34.0 pg   MCHC 32.6 30.0 - 36.0 g/dL   RDW 01.012.3 27.211.5 - 53.615.5 %   Platelets 199 150 - 400 K/uL   nRBC 0.0 0.0 - 0.2 %    Comment: Performed at [facility], [address] [place], [place] [address]  Rapid urine drug screen  (hospital performed)     Status: None   Collection Time: 11/08/20  9:02 AM  Result Value Ref Range   Opiates NONE DETECTED NONE DETECTED   Cocaine NONE DETECTED NONE DETECTED   Benzodiazepines NONE DETECTED NONE DETECTED   Amphetamines NONE DETECTED NONE DETECTED   Tetrahydrocannabinol NONE DETECTED NONE DETECTED  Barbiturates NONE DETECTED NONE DETECTED    Comment: (NOTE) DRUG SCREEN FOR MEDICAL PURPOSES ONLY.  IF CONFIRMATION IS NEEDED FOR ANY PURPOSE, NOTIFY LAB WITHIN 5 DAYS.  LOWEST DETECTABLE LIMITS FOR URINE DRUG SCREEN Drug Class                     Cutoff (ng/mL) Amphetamine and metabolites    1000 Barbiturate and metabolites    200 Benzodiazepine                 200 Tricyclics and metabolites     300 Opiates and metabolites        300 Cocaine and metabolites        300 THC                            50 Performed at [facility], [address] [place], [place] [address]     Blood Alcohol level:  Lab Results  Component Value Date   ETH <10 11/07/2020   ETH <10 08/31/2020    Physical Findings: AIMS:  , ,  ,  ,    CIWA:    COWS:     Musculoskeletal: Strength & Muscle Tone: within normal limits Gait & Station: normal Patient leans: N/A  Psychiatric Specialty Exam:  Presentation  General Appearance: Appropriate for Environment  Eye Contact:Good  Speech:Clear and Coherent; Normal Rate  Speech Volume:Normal  Handedness:Right   Mood and Affect  Mood:Euthymic  Affect:Congruent   Thought Process  Thought Processes:Coherent; Goal Directed  Descriptions of Associations:Intact  Orientation:Full (Time, Place and Person)  Thought Content:Logical  History of Schizophrenia/Schizoaffective disorder:No  Duration of Psychotic Symptoms:Less than six months  Hallucinations:Hallucinations: None Ideas of Reference:Delusions  Suicidal Thoughts:Suicidal Thoughts: No Homicidal Thoughts:Homicidal Thoughts: No  Sensorium   Memory:Immediate Good; Recent Good; Remote Good  Judgment:Poor  Insight:Fair   Executive Functions  Concentration:Good  Attention Span:Good  Recall:Good  Fund of Knowledge:Good  Language:Good   Psychomotor Activity  Psychomotor Activity: Psychomotor Activity: Normal  Assets  Assets:Communication Skills; Desire for Improvement; Financial Resources/Insurance; Housing; Intimacy; Leisure Time; Social Support; Vocational/Educational   Sleep  Sleep: Sleep: Fair   Physical Exam: Physical Exam Vitals and nursing note reviewed.  Cardiovascular:     Rate and Rhythm: Bradycardia present.  Pulmonary:     Effort: Pulmonary effort is normal.  Musculoskeletal:        General: Normal range of motion.  Skin:    General: Skin is warm and dry.  Neurological:     Mental Status: He is alert and oriented to person, place, and time.  Psychiatric:        Attention and Perception: Attention normal.        Mood and Affect: Mood normal.        Speech: Speech normal.        Behavior: Behavior normal. Behavior is cooperative.        Thought Content: Thought content is delusional. Thought content is not paranoid. Thought content does not include homicidal or suicidal ideation. Thought content does not include homicidal or suicidal plan.   Review of Systems  Constitutional:  Negative for chills and fever.  Respiratory:  Negative for shortness of breath.   Cardiovascular:  Negative for chest pain.  Gastrointestinal:  Negative for abdominal pain, nausea and vomiting.  Neurological:  Negative for dizziness, weakness and headaches.  Psychiatric/Behavioral:  Negative for depression, hallucinations, memory loss, substance abuse and  suicidal ideas. The patient is not nervous/anxious and does not have insomnia.   Blood pressure 127/71, pulse (!) 58, temperature 97.8 F (36.6 C), temperature source Oral, resp. rate 18, SpO2 100 %. There is no height or weight on file to calculate  BMI.  Treatment Plan Summary: Daily contact with patient to assess and evaluate symptoms and progress in treatment, Medication management, and Plan Meets criteria for inpatient psychiatric admission. CT head ordered with negative result.   Novella Olive, NP 11/08/2020, 1:13 PM

## 2020-11-08 NOTE — ED Notes (Signed)
Pt entered room 8 and grabbed gown of bed and put it over burgandy scrubs. This RN asked pt what he was doing and pt stated "this was mine from before". This RN attempted to speak with patient and state that gown was for medical patients and we had just placed that on the bed for a new patient and that we had given him purple scrubs to wear. Pt sat in chair, this RN returned to computer. CNA then saw pt run down hallway and out of ED, pt ran through garden. Security aware, 2 RN's outside looking for patient. BH team made aware. RN's returned to their assignment, Security still looking for patient. Pt's purple scrub found in bathroom by room 5

## 2020-11-08 NOTE — ED Provider Notes (Signed)
Emergency Medicine Observation Re-evaluation Note  EZRIEL BOFFA is a 25 y.o. male, seen on rounds today.  Pt initially presented to the ED for complaints of IVC Currently, the patient is sitting on stretcher.  Physical Exam  BP (!) 143/81   Pulse 60   Temp 98.7 F (37.1 C) (Oral)   Resp 17   SpO2 100%  Physical Exam General: calm Cardiac: normal rate Lungs: no increased WOB Psych: calm, cooperative  ED Course / MDM  EKG:   I have reviewed the labs performed to date as well as medications administered while in observation.  Recent changes in the last 24 hours include none.  Plan  Current plan is for reevaluation this morning.  Wendi Snipes is not under involuntary commitment.     Rolan Bucco, MD 11/08/20 [phone]

## 2020-11-08 NOTE — ED Notes (Signed)
PT asking for update, this RN told pt they had found a bed for him at [facility] facility and we were waiting on an update for when bed is ready. Pt was confused why he was going to [facility] facility. This RN attempted to explain to pt why BH team thought this was appropriate, but pt stated "I passed the test, I'm not going to hurt anyone and she told me I would be discharged". BH NP made aware, stated she would come speak w pt when she had time, pt made aware

## 2020-11-08 NOTE — ED Notes (Signed)
Pt has still not returned to the department. Security informed [employer] that patient eloped. Security and [employer] are still looking for the patient.

## 2020-11-08 NOTE — ED Notes (Signed)
Brought pt snack 
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
## 2020-11-08 NOTE — BH Assessment (Signed)
[facility] Assessment Progress Note  Per Dorena Bodo, NP, this pt requires psychiatric hospitalization.  Linsey, RN, [facility] has assigned pt to [facility] Rm 501-1 to the service of Dr Mason Jim.  Bed is not ready yet, but Linsey agrees to inform [facility] when it becomes available.  Pt presents under IVC initiated by Danae Chen (relationship unspecified), and upheld by EDP Ross Marcus, MD, and IVC documents have been sent  to [facility].  EDP Rolan Bucco, MD and pt's nurses, Norberta Keens and Sammuel Bailiff, have been notified, and nurses agrees to call report to [phone].  Pt is to be transported via Patent examiner.   Doylene Canning, [place] [facility] Coordinator [phone]

## 2020-11-08 NOTE — ED Notes (Signed)
Patient transported to CT 

## 2020-11-11 ENCOUNTER — Emergency Department: Payer: Self-pay

## 2020-11-11 ENCOUNTER — Encounter: Payer: Self-pay | Admitting: Emergency Medicine

## 2020-11-11 ENCOUNTER — Emergency Department
Admission: EM | Admit: 2020-11-11 | Discharge: 2020-11-12 | Disposition: A | Discharge status: Other Acute Inpatient Hospital | Payer: Federal, State, Local not specified - Other | Attending: Student in an Organized Health Care Education/Training Program | Admitting: Student in an Organized Health Care Education/Training Program

## 2020-11-11 ENCOUNTER — Other Ambulatory Visit: Payer: Self-pay

## 2020-11-11 DIAGNOSIS — F2081 Schizophreniform disorder: Secondary | ICD-10-CM | POA: Diagnosis not present

## 2020-11-11 DIAGNOSIS — R456 Violent behavior: Secondary | ICD-10-CM | POA: Insufficient documentation

## 2020-11-11 DIAGNOSIS — F22 Delusional disorders: Secondary | ICD-10-CM | POA: Insufficient documentation

## 2020-11-11 DIAGNOSIS — F29 Unspecified psychosis not due to a substance or known physiological condition: Secondary | ICD-10-CM | POA: Diagnosis present

## 2020-11-11 DIAGNOSIS — Z23 Encounter for immunization: Secondary | ICD-10-CM | POA: Insufficient documentation

## 2020-11-11 DIAGNOSIS — Y900 Blood alcohol level of less than 20 mg/100 ml: Secondary | ICD-10-CM | POA: Insufficient documentation

## 2020-11-11 DIAGNOSIS — Z20822 Contact with and (suspected) exposure to covid-19: Secondary | ICD-10-CM | POA: Insufficient documentation

## 2020-11-11 LAB — URINE DRUG SCREEN, QUALITATIVE (ARMC ONLY)
Amphetamines, Ur Screen: NOT DETECTED
Barbiturates, Ur Screen: NOT DETECTED
Benzodiazepine, Ur Scrn: NOT DETECTED
Cannabinoid 50 Ng, Ur ~~LOC~~: NOT DETECTED
Cocaine Metabolite,Ur ~~LOC~~: NOT DETECTED
MDMA (Ecstasy)Ur Screen: NOT DETECTED
Methadone Scn, Ur: NOT DETECTED
Opiate, Ur Screen: NOT DETECTED
Phencyclidine (PCP) Ur S: NOT DETECTED
Tricyclic, Ur Screen: NOT DETECTED

## 2020-11-11 LAB — COMPREHENSIVE METABOLIC PANEL
ALT: 25 U/L (ref 0–44)
AST: 36 U/L (ref 15–41)
Albumin: 4.3 g/dL (ref 3.5–5.0)
Alkaline Phosphatase: 50 U/L (ref 38–126)
Anion gap: 8 (ref 5–15)
BUN: 10 mg/dL (ref 6–20)
CO2: 25 mmol/L (ref 22–32)
Calcium: 9.2 mg/dL (ref 8.9–10.3)
Chloride: 106 mmol/L (ref 98–111)
Creatinine, Ser: 1.08 mg/dL (ref 0.61–1.24)
GFR, Estimated: >60 mL/min (ref >60)
Glucose, Bld: 95 mg/dL (ref 70–99)
Potassium: 3.6 mmol/L (ref 3.5–5.1)
Sodium: 139 mmol/L (ref 135–145)
Total Bilirubin: 2 mg/dL — ABNORMAL HIGH (ref 0.3–1.2)
Total Protein: 7.8 g/dL (ref 6.5–8.1)

## 2020-11-11 LAB — ETHANOL: Alcohol, Ethyl (B): <10 mg/dL (ref <10)

## 2020-11-11 LAB — ACETAMINOPHEN LEVEL: Acetaminophen (Tylenol), Serum: <10 ug/mL — ABNORMAL LOW (ref 10–30)

## 2020-11-11 LAB — CBC
HCT: 39.1 % (ref 39.0–52.0)
Hemoglobin: 13.3 g/dL (ref 13.0–17.0)
MCH: 33.7 pg (ref 26.0–34.0)
MCHC: 34 g/dL (ref 30.0–36.0)
MCV: 99 fL (ref 80.0–100.0)
Platelets: 229 10*3/uL (ref 150–400)
RBC: 3.95 MIL/uL — ABNORMAL LOW (ref 4.22–5.81)
RDW: 12.1 % (ref 11.5–15.5)
WBC: 6.3 10*3/uL (ref 4.0–10.5)
nRBC: 0 % (ref 0.0–0.2)

## 2020-11-11 LAB — SALICYLATE LEVEL: Salicylate Lvl: <7 mg/dL — ABNORMAL LOW (ref 7.0–30.0)

## 2020-11-11 MED ORDER — TETANUS-DIPHTH-ACELL PERTUSSIS 5-2.5-18.5 LF-MCG/0.5 IM SUSY
0.5000 mL | PREFILLED_SYRINGE | Freq: Once | INTRAMUSCULAR | Status: AC
  Administered 2020-11-11: 0.5 mL via INTRAMUSCULAR
  Filled 2020-11-11: qty 0.5

## 2020-11-11 MED ORDER — BACITRACIN ZINC 500 UNIT/GM EX OINT
TOPICAL_OINTMENT | Freq: Once | CUTANEOUS | Status: DC
  Filled 2020-11-11: qty 0.9

## 2020-11-11 NOTE — Consult Note (Signed)
[facility]   Reason for Consult:  Psychiatric evaluation Referring Physician:  Dr. Roxan Hockeyobinson Patient Identification: Matthew SnipesDeven D Munoz MRN:  [id number] Principal Diagnosis: Psychosis [facility](HCC) Diagnosis:  Principal Problem:   Psychosis (HCC)   Total Time spent with patient: 1 hour  Subjective:   Matthew Munoz is a 25 y.o. male patient [facility]. Pt in via [employer] under IVC. Pt reports he is here fur to running from himself. [employer] reports when pt ran from the police he cut his left foot. Pt with bandage applied to left foot. Pt denies SI/HI  HPI:  Matthew Munoz, 25 y.o., male patient  by this provider, consulted with Dr. Roxan Hockeyobinson; and chart reviewed on 11/12/20.  On evaluation Matthew Munoz reports that hes here because he was IVC'd.  " I was running around taking my clothes off.  I know that may seem abnormal, but nothings wrong with me." I also states that he ran from the police because he became anxious.   Patient denies taking his medication. He says he doesn't need them.  Per EDP, Matthew Munoz is a 25 y.o. male below listed past medical history presents to the ER from [facility] under IVC due to increasing delusional and paranoid behavior.  Reportedly stole a bike and was running around [place] naked.  Was running from the police cut his foot while evading police.  States he does feel paranoid.  Does appear somewhat disorganized only responding to questions in yes or no responses.   Patient was at facility on the third and the fourth for similar presentation was on IVC.  It is unclear from records whether he was cleared from IVC or eloped.    During evaluation Matthew Munoz is laying in hallway bed in no acute distress.  He is alert, oriented x 4, calm and cooperative. His mood is euthymic with congruent affect.  He does not appear to be responding to internal/external stimuli or delusional thoughts.  Patient denies suicidal/self-harm/homicidal ideation, psychosis, and  paranoia.  Patient answered question appropriately.   Past Psychiatric History: Schizophrenia  Risk to Self:   Risk to Others:   Prior Inpatient Therapy:   Prior Outpatient Therapy:    Past Medical History: History reviewed. No pertinent past medical history. History reviewed. No pertinent surgical history. Family History: No family history on file. Family Psychiatric  History: unknown Social History:  Social History   Substance and Sexual Activity  Alcohol Use Never     Social History   Substance and Sexual Activity  Drug Use Never    Social History   Socioeconomic History   Marital status: Single    Spouse name: Not on file   Number of children: Not on file   Years of education: Not on file   Highest education level: Not on file  Occupational History   Not on file  Tobacco Use   Smoking status: Never   Smokeless tobacco: Never  Substance and Sexual Activity   Alcohol use: Never   Drug use: Never   Sexual activity: Not on file  Other Topics Concern   Not on file  Social History Narrative   Not on file   Social Determinants of Health   Financial Resource Strain: Not on file  Food Insecurity: Not on file  Transportation Needs: Not on file  Physical Activity: Not on file  Stress: Not on file  Social Connections: Not on file   Additional Social History:  Allergies:   Allergies  Allergen Reactions   Avocado Swelling    Labs:  Results for orders placed or performed during the hospital encounter of 11/11/20 (from the past 48 hour(s))  Comprehensive metabolic panel     Status: Abnormal   Collection Time: 11/11/20  4:54 PM  Result Value Ref Range   Sodium 139 135 - 145 mmol/L   Potassium 3.6 3.5 - 5.1 mmol/L   Chloride 106 98 - 111 mmol/L   CO2 25 22 - 32 mmol/L   Glucose, Bld 95 70 - 99 mg/dL    Comment: Glucose reference range applies only to samples taken after fasting for at least 8 hours.   BUN 10 6 - 20 mg/dL   Creatinine, Ser 6.57 0.61 -  1.24 mg/dL   Calcium 9.2 8.9 - 84.6 mg/dL   Total Protein 7.8 6.5 - 8.1 g/dL   Albumin 4.3 3.5 - 5.0 g/dL   AST 36 15 - 41 U/L   ALT 25 0 - 44 U/L   Alkaline Phosphatase 50 38 - 126 U/L   Total Bilirubin 2.0 (H) 0.3 - 1.2 mg/dL   GFR, Estimated >96 >29 mL/min    Comment: (NOTE) Calculated using the CKD-EPI Creatinine Equation (2021)    Anion gap 8 5 - 15    Comment: Performed at [facility], [address] [place], [place] [address]  Ethanol     Status: None   Collection Time: 11/11/20  4:54 PM  Result Value Ref Range   Alcohol, Ethyl (B) <10 <10 mg/dL    Comment: (NOTE) Lowest detectable limit for serum alcohol is 10 mg/dL.  For medical purposes only. Performed at [facility], [address] [place], [place] [address]   Salicylate level     Status: Abnormal   Collection Time: 11/11/20  4:54 PM  Result Value Ref Range   Salicylate Lvl <7.0 (L) 7.0 - 30.0 mg/dL    Comment: Performed at [facility], [address] [place], [place] [address]  Acetaminophen level     Status: Abnormal   Collection Time: 11/11/20  4:54 PM  Result Value Ref Range   Acetaminophen (Tylenol), Serum <10 (L) 10 - 30 ug/mL    Comment: (NOTE) Therapeutic concentrations vary significantly. A range of 10-30 ug/mL  may be an effective concentration for many patients. However, some  are best treated at concentrations outside of this range. Acetaminophen concentrations >150 ug/mL at 4 hours after ingestion  and >50 ug/mL at 12 hours after ingestion are often associated with  toxic reactions.  Performed at [facility] [place], [address] [place], [place] [address]   cbc     Status: Abnormal   Collection Time: 11/11/20  4:54 PM  Result Value Ref Range   WBC 6.3 4.0 - 10.5 K/uL   RBC 3.95 (L) 4.22 - 5.81 MIL/uL   Hemoglobin 13.3 13.0 - 17.0 g/dL   HCT 40.3 47.4 - 25.9 %   MCV 99.0 80.0 - 100.0 fL   MCH 33.7 26.0 - 34.0 pg   MCHC 34.0 30.0 - 36.0 g/dL   RDW  56.3 87.5 - 64.3 %   Platelets 229 150 - 400 K/uL   nRBC 0.0 0.0 - 0.2 %    Comment: Performed at [facility], [address] [place], [place] [address]  Urine Drug Screen, Qualitative     Status: None   Collection Time: 11/11/20  4:54 PM  Result Value Ref Range   Tricyclic,  Ur Screen NONE DETECTED NONE DETECTED   Amphetamines, Ur Screen NONE DETECTED NONE DETECTED   MDMA (Ecstasy)Ur Screen NONE DETECTED NONE DETECTED   Cocaine Metabolite,Ur [place] NONE DETECTED NONE DETECTED   Opiate, Ur Screen NONE DETECTED NONE DETECTED   Phencyclidine (PCP) Ur S NONE DETECTED NONE DETECTED   Cannabinoid 50 Ng, Ur [place] NONE DETECTED NONE DETECTED   Barbiturates, Ur Screen NONE DETECTED NONE DETECTED   Benzodiazepine, Ur Scrn NONE DETECTED NONE DETECTED   Methadone Scn, Ur NONE DETECTED NONE DETECTED    Comment: (NOTE) Tricyclics + metabolites, urine    Cutoff 1000 ng/mL Amphetamines + metabolites, urine  Cutoff 1000 ng/mL MDMA (Ecstasy), urine              Cutoff 500 ng/mL Cocaine Metabolite, urine          Cutoff 300 ng/mL Opiate + metabolites, urine        Cutoff 300 ng/mL Phencyclidine (PCP), urine         Cutoff 25 ng/mL Cannabinoid, urine                 Cutoff 50 ng/mL Barbiturates + metabolites, urine  Cutoff 200 ng/mL Benzodiazepine, urine              Cutoff 200 ng/mL Methadone, urine                   Cutoff 300 ng/mL  The urine drug screen provides only a preliminary, unconfirmed analytical test result and should not be used for non-medical purposes. Clinical consideration and professional judgment should be applied to any positive drug screen result due to possible interfering substances. A more specific alternate chemical method must be used in order to obtain a confirmed analytical result. Gas chromatography / mass spectrometry (GC/MS) is the preferred confirm atory method. Performed at [facility], [address] [place], [place] [address]   Resp Panel by  RT-PCR (Flu A&B, Covid) Nasopharyngeal Swab     Status: None   Collection Time: 11/12/20 12:43 AM   Specimen: Nasopharyngeal Swab; Nasopharyngeal(NP) swabs in vial transport medium  Result Value Ref Range   SARS Coronavirus 2 by RT PCR NEGATIVE NEGATIVE    Comment: (NOTE) SARS-CoV-2 target nucleic acids are NOT DETECTED.  The SARS-CoV-2 RNA is generally detectable in upper respiratory specimens during the acute phase of infection. The lowest concentration of SARS-CoV-2 viral copies this assay can detect is 138 copies/mL. A negative result does not preclude SARS-Cov-2 infection and should not be used as the sole basis for treatment or other patient management decisions. A negative result may occur with  improper specimen collection/handling, submission of specimen other than nasopharyngeal swab, presence of viral mutation(s) within the areas targeted by this assay, and inadequate number of viral copies(<138 copies/mL). A negative result must be combined with clinical observations, patient history, and epidemiological information. The expected result is Negative.  Fact Sheet for Patients:  BloggerCourse.com  Fact Sheet for Healthcare Providers:  SeriousBroker.it  This test is no t yet approved or cleared by the [place] FDA and  has been authorized for detection and/or diagnosis of SARS-CoV-2 by FDA under an Emergency Use Authorization (EUA). This EUA will remain  in effect (meaning this test can be used) for the duration of the COVID-19 declaration under Section 564(b)(1) of the Act, 21 U.S.C.section 360bbb-3(b)(1), unless the authorization is terminated  or revoked sooner.       Influenza A by PCR NEGATIVE NEGATIVE   Influenza  B by PCR NEGATIVE NEGATIVE    Comment: (NOTE) The Xpert Xpress SARS-CoV-2/FLU/RSV plus assay is intended as an aid in the diagnosis of influenza from Nasopharyngeal swab specimens and should not be  used as a sole basis for treatment. Nasal washings and aspirates are unacceptable for Xpert Xpress SARS-CoV-2/FLU/RSV testing.  Fact Sheet for Patients: BloggerCourse.com  Fact Sheet for Healthcare Providers: SeriousBroker.it  This test is not yet approved or cleared by the [place] FDA and has been authorized for detection and/or diagnosis of SARS-CoV-2 by FDA under an Emergency Use Authorization (EUA). This EUA will remain in effect (meaning this test can be used) for the duration of the COVID-19 declaration under Section 564(b)(1) of the Act, 21 U.S.C. section 360bbb-3(b)(1), unless the authorization is terminated or revoked.  Performed at [facility], [address] [place], [place] [address]     Current Facility-Administered Medications  Medication Dose Route Frequency Provider Last Rate Last Admin   bacitracin ointment   Topical Once Willy Eddy, MD       No current outpatient medications on file.    Musculoskeletal: Strength & Muscle Tone: within normal limits Gait & Station: normal Patient leans: N/A  Psychiatric Specialty Exam:  Presentation  General Appearance: Appropriate for Environment  Eye Contact:Fair  Speech:Clear and Coherent  Speech Volume:Normal  Handedness:Right   Mood and Affect  Mood:Anxious; Labile  Affect:Blunt   Thought Process  Thought Processes:Goal Directed  Descriptions of Associations:Intact  Orientation:Full (Time, Place and Person)  Thought Content:Paranoid Ideation; Scattered  History of Schizophrenia/Schizoaffective disorder:Yes  Duration of Psychotic Symptoms:Greater than six months  Hallucinations:Hallucinations: None Ideas of Reference:Paranoia; Delusions  Suicidal Thoughts:Suicidal Thoughts: No Homicidal Thoughts:Homicidal Thoughts: No  Sensorium  Memory:Immediate Fair; Remote  Fair  Judgment:Impaired  Insight:Lacking   Executive Functions  Concentration:Fair  Attention Span:Fair  Recall:Fair  Fund of Knowledge:Fair  Language:Fair   Psychomotor Activity  Psychomotor Activity: Psychomotor Activity: Normal  Assets  Assets:Desire for Improvement; Housing; Social Support; Physical Health   Sleep  Sleep: Sleep: Fair  Physical Exam: Physical Exam Vitals and nursing note reviewed.  Constitutional:      Appearance: Normal appearance.  HENT:     Head: Normocephalic and atraumatic.     Nose: Nose normal.  Eyes:     Pupils: Pupils are equal, round, and reactive to light.  Cardiovascular:     Rate and Rhythm: Normal rate.  Pulmonary:     Effort: Pulmonary effort is normal.  Musculoskeletal:     Cervical back: Normal range of motion.  Neurological:     Mental Status: He is alert.   ROS Blood pressure (!) 144/95, pulse 83, temperature 98.4 F (36.9 C), temperature source Oral, resp. rate 20, height 6\' 4"  (1.93 m), weight 77.1 kg, SpO2 100 %. Body mass index is 20.69 kg/m.  Treatment Plan Summary: Daily contact with patient to assess and evaluate symptoms and progress in treatment and Medication management  Disposition: Recommend psychiatric Inpatient admission when medically cleared. Supportive therapy provided about ongoing stressors. Discussed crisis plan, support from social network, calling 911, coming to the Emergency Department, and calling Suicide Hotline.  , NP 11/12/2020 2:13 AM

## 2020-11-11 NOTE — ED Triage Notes (Signed)
Pt in via [employer] under IVC. Pt reports he is here fur to running from himself. [employer] reports when pt ran from the police he cut his left foot. Pt with bandage applied to left foot. Pt denies SI/HI

## 2020-11-11 NOTE — ED Notes (Signed)
IVC from [facility]  all papers done pending consult

## 2020-11-11 NOTE — ED Notes (Signed)
Dinner tray delivered.

## 2020-11-11 NOTE — ED Notes (Signed)
Pt taken to xray 

## 2020-11-11 NOTE — ED Provider Notes (Signed)
[facility] Emergency Department Provider Note    Event Date/Time   First MD Initiated Contact with Patient 11/11/20 1701     (approximate)  I have reviewed the triage vital signs and the nursing notes.   HISTORY  Chief Complaint Mental Health Problem and Aggressive Behavior    HPI Matthew Munoz is a 25 y.o. male below listed past medical history presents to the ER from [facility] under IVC due to increasing delusional and paranoid behavior.  Reportedly stole a bike and was running [place] naked.  Was running from the police cut his foot while evading police.  States he does feel paranoid.  Does appear somewhat disorganized only responding to questions in yes or no responses.  Patient was at facility on the third and the fourth for similar presentation was on IVC.  It is unclear from records whether he was cleared from IVC or eloped.  History reviewed. No pertinent past medical history. No family history on file. History reviewed. No pertinent surgical history. 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 ____________________________________________   PHYSICAL EXAM:  VITAL SIGNS: Vitals:   11/11/20 1645  BP: (!) 144/95  Pulse: 83  Resp: 20  Temp: 98.4 F (36.9 C)  SpO2: 100%    Constitutional: Alert and oriented.  Eyes: Conjunctivae are normal.  Head:  Atraumatic. Nose: No congestion/rhinnorhea. Mouth/Throat: Mucous membranes are moist.   Neck: No stridor. Painless ROM.  Cardiovascular: Normal rate, regular rhythm. Grossly normal heart sounds.  Good peripheral circulation. Respiratory: Normal respiratory effort.  No retractions. Lungs CTAB. Gastrointestinal: Soft and nontender. No distention. No abdominal bruits. No CVA tenderness. Genitourinary:  Musculoskeletal: No lower extremity tenderness nor edema.  No joint effusions.  1cm avulsion injury on the base of his left foot under the third MCP.  His fairly superficial hemostatic.  Does have some gravel which was removed but no other foreign body.  Not amenable to suture lack repair.  Will provide antibiotic dressing. Neurologic:  Normal speech and language. No gross focal neurologic deficits are appreciated. No facial droop Skin:  Skin is warm, dry and intact. No rash noted. Psychiatric: somewhat odd, currently cooperative and calm  ____________________________________________   LABS (all labs ordered are listed, but only abnormal results are displayed)  Results for orders placed or performed during the hospital encounter of 11/11/20 (from the past 24 hour(s))  cbc     Status: Abnormal   Collection Time: 11/11/20  4:54 PM  Result Value Ref Range   WBC 6.3 4.0 - 10.5 K/uL   RBC 3.95 (L) 4.22 - 5.81 MIL/uL   Hemoglobin 13.3 13.0 - 17.0 g/dL   HCT 10.2 58.5 - 27.7 %   MCV 99.0 80.0 - 100.0 fL   MCH 33.7 26.0 - 34.0 pg   MCHC 34.0 30.0 - 36.0 g/dL   RDW 82.4 23.5 - 36.1 %  Platelets 229 150 - 400 K/uL   nRBC 0.0 0.0 - 0.2 %   ____________________________________________  EKG____________________________________________  RADIOLOGY  I personally reviewed all radiographic images ordered to evaluate for the above acute complaints and reviewed radiology reports and findings.  These findings were personally discussed with the patient.  Please see medical record for radiology  report.  ____________________________________________   PROCEDURES  Procedure(s) performed:  Procedures    Critical Care performed: no ____________________________________________   INITIAL IMPRESSION / ASSESSMENT AND PLAN / ED COURSE  Pertinent labs & imaging results that were available during my care of the patient were reviewed by me and considered in my medical decision making (see chart for details).   DDX: Psychosis, delirium, medication effect, noncompliance, polysubstance abuse, Si, Hi, depression   Kendall D Abdullah is a 25 y.o. who presents to the ED with for evaluation of delusional behavior and apparent psychosis.  Patient has psych history of delusions.  Laboratory testing was ordered to evaluation for underlying electrolyte derangement or signs of underlying organic pathology to explain today's presentation.  Based on history and physical and laboratory evaluation, it appears that the patient's presentation is 2/2 underlying psychiatric disorder and will require further evaluation and management by inpatient psychiatry.  Patient was  made an IVC at [facility].  Disposition pending psychiatric evaluation.  The patient has been placed in psychiatric observation due to the need to provide a safe environment for the patient while obtaining psychiatric consultation and evaluation, as well as ongoing medical and medication management to treat the patient's condition.  The patient has been placed under full IVC at this time.     The patient was evaluated in Emergency Department today for the symptoms described in the history of present illness. He/she was evaluated in the context of the global COVID-19 pandemic, which necessitated consideration that the patient might be at risk for infection with the SARS-CoV-2 virus that causes COVID-19. Institutional protocols and algorithms that pertain to the evaluation of patients at risk for COVID-19 are in a state of rapid change based on information  released by regulatory bodies including the CDC and federal and state organizations. These policies and algorithms were followed during the patient's care in the ED.  As part of my medical decision making, I reviewed the following data within the electronic MEDICAL RECORD NUMBER Nursing notes reviewed and incorporated, Labs reviewed, notes from prior ED visits and [place] Controlled Substance Database   ____________________________________________   FINAL CLINICAL IMPRESSION(S) / ED DIAGNOSES  Final diagnoses:  Paranoia (psychosis) (HCC)      NEW MEDICATIONS STARTED DURING THIS VISIT:  New Prescriptions   No medications on file     Note:  This document was prepared using Dragon voice recognition software and may include unintentional dictation errors.    Willy Eddy, MD 11/11/20 1723

## 2020-11-12 ENCOUNTER — Inpatient Hospital Stay
Admission: AD | Admit: 2020-11-12 | Discharge: 2020-11-16 | DRG: 885 | Disposition: A | Discharge status: Home / Self Care | Payer: 59 | Source: Ambulatory Visit | Attending: Psychiatry | Admitting: Psychiatry

## 2020-11-12 ENCOUNTER — Other Ambulatory Visit: Payer: Self-pay

## 2020-11-12 ENCOUNTER — Encounter: Payer: Self-pay | Admitting: Psychiatry

## 2020-11-12 DIAGNOSIS — I10 Essential (primary) hypertension: Secondary | ICD-10-CM | POA: Diagnosis present

## 2020-11-12 DIAGNOSIS — F419 Anxiety disorder, unspecified: Secondary | ICD-10-CM | POA: Diagnosis present

## 2020-11-12 DIAGNOSIS — Z79899 Other long term (current) drug therapy: Secondary | ICD-10-CM | POA: Diagnosis not present

## 2020-11-12 DIAGNOSIS — Z818 Family history of other mental and behavioral disorders: Secondary | ICD-10-CM

## 2020-11-12 DIAGNOSIS — F23 Brief psychotic disorder: Secondary | ICD-10-CM | POA: Diagnosis present

## 2020-11-12 DIAGNOSIS — F203 Undifferentiated schizophrenia: Principal | ICD-10-CM | POA: Diagnosis present

## 2020-11-12 DIAGNOSIS — G4700 Insomnia, unspecified: Secondary | ICD-10-CM | POA: Diagnosis present

## 2020-11-12 DIAGNOSIS — F2081 Schizophreniform disorder: Secondary | ICD-10-CM | POA: Diagnosis present

## 2020-11-12 DIAGNOSIS — I340 Nonrheumatic mitral (valve) insufficiency: Secondary | ICD-10-CM | POA: Diagnosis present

## 2020-11-12 DIAGNOSIS — Z91018 Allergy to other foods: Secondary | ICD-10-CM

## 2020-11-12 LAB — RESP PANEL BY RT-PCR (FLU A&B, COVID) ARPGX2
Influenza A by PCR: NEGATIVE
Influenza B by PCR: NEGATIVE
SARS Coronavirus 2 by RT PCR: NEGATIVE

## 2020-11-12 MED ORDER — ALUM & MAG HYDROXIDE-SIMETH 200-200-20 MG/5ML PO SUSP: 30.0000 mL | ORAL | Status: DC | PRN

## 2020-11-12 MED ORDER — ACETAMINOPHEN 325 MG PO TABS: 650.0000 mg | ORAL_TABLET | Freq: Four times a day (QID) | ORAL | Status: DC | PRN

## 2020-11-12 MED ORDER — LORAZEPAM 1 MG PO TABS: 1.0000 mg | ORAL_TABLET | ORAL | Status: DC | PRN

## 2020-11-12 MED ORDER — HYDROXYZINE HCL 50 MG PO TABS: 50.0000 mg | ORAL_TABLET | Freq: Four times a day (QID) | ORAL | Status: DC | PRN

## 2020-11-12 MED ORDER — MAGNESIUM HYDROXIDE 400 MG/5ML PO SUSP: 30.0000 mL | Freq: Every day | ORAL | Status: DC | PRN

## 2020-11-12 MED ORDER — TRAZODONE HCL 50 MG PO TABS: 50.0000 mg | ORAL_TABLET | Freq: Every evening | ORAL | Status: DC | PRN

## 2020-11-12 MED ORDER — OLANZAPINE 5 MG PO TBDP: 5.0000 mg | ORAL_TABLET | Freq: Three times a day (TID) | ORAL | Status: DC | PRN

## 2020-11-12 MED ORDER — ZIPRASIDONE MESYLATE 20 MG IM SOLR: 20.0000 mg | INTRAMUSCULAR | Status: DC | PRN

## 2020-11-12 MED ORDER — HYDROXYZINE HCL 25 MG PO TABS: 25.0000 mg | ORAL_TABLET | Freq: Three times a day (TID) | ORAL | Status: DC | PRN

## 2020-11-12 MED ORDER — MAGNESIUM HYDROXIDE 400 MG/5ML PO SUSP
30.0000 mL | Freq: Every day | ORAL | Status: DC | PRN
  Administered 2020-11-14: 30 mL via ORAL
  Filled 2020-11-12: qty 30

## 2020-11-12 MED ORDER — ACETAMINOPHEN 325 MG PO TABS
650.0000 mg | ORAL_TABLET | Freq: Four times a day (QID) | ORAL | Status: DC | PRN
Start: 2020-11-12 — End: 2020-11-16

## 2020-11-12 NOTE — Progress Notes (Signed)
Skin and contraband search completed by Clinical research associate and witnessed by [employer], Charity fundraiser. Left 2nd and 5th digit on foot has abrasions as well as bottom of left foot. No contraband found.

## 2020-11-12 NOTE — ED Notes (Signed)
IVC/pending psych inpatient admission when medically cleared 

## 2020-11-12 NOTE — ED Notes (Signed)
Pt given clean sheet to change his bed per request.

## 2020-11-12 NOTE — Progress Notes (Signed)
   11/12/20 1315  Clinical Encounter Type  Visited With Patient  Visit Type Initial;Spiritual support;Social support  Referral From Other (Comment) (rounding)  Spiritual Encounters  Spiritual Needs Sacred text  Chaplain Burris greeted Pt; provided education on chaplain's role and asked how to best support Pt. Pt asked for something to read and said a bible would be acceptable. Chaplain Burris left unit to retrieve text and upon return suggested some passages. Offered words of encouragement and let Pt know that if he desired to talk that a chaplain is available 24/7.

## 2020-11-12 NOTE — ED Notes (Addendum)
Pt given phone at this time. 

## 2020-11-12 NOTE — Tx Team (Signed)
Initial Treatment Plan 11/12/2020 10:09 PM [name] MBT:[id number]    PATIENT STRESSORS: Medication change or noncompliance Other: Patient stressed about his future and finding a job   PATIENT STRENGTHS: Ability for insight Motivation for treatment/growth   PATIENT IDENTIFIED PROBLEMS: Anxitey  Acute psychosis                    DISCHARGE CRITERIA:  Improved stabilization in mood, thinking, and/or behavior Verbal commitment to aftercare and medication compliance  PRELIMINARY DISCHARGE PLAN: Outpatient therapy Return to previous living arrangement  PATIENT/FAMILY INVOLVEMENT: This treatment plan has been presented to and reviewed with the patient, Matthew Munoz. The patient has been given the opportunity to ask questions and make suggestions.  Elmyra Ricks, RN 11/12/2020, 10:09 PM

## 2020-11-12 NOTE — ED Notes (Signed)
Report given to [facility] in BM[facility]. Patient aware of plan of care.

## 2020-11-12 NOTE — BH Assessment (Signed)
Comprehensive Clinical Assessment (CCA) Note  11/12/2020 Matthew Munoz [id number]  Matthew Munoz, 25 year old male who presents to [facility] ED involuntarily for treatment. Per triage note, Pt in via [employer] under IVC. Pt reports he is here fur to running from himself. [employer] reports when pt ran from the police he cut his left foot. Pt with bandage applied to left foot. Pt denies SI/HI.    During TTS assessment pt presents alert and oriented x 4, anxious but cooperative, and mood-congruent with affect. The pt does not appear to be responding to internal or external stimuli. Neither is the pt presenting with any delusional thinking. Pt verified the information provided to triage RN.   Pt reports he was brought to the ED after having a psychotic episode in public. Patient reports he recently has been hearing voices and paranoid that people are out to get him. Patient states he is not employed but looking for work. "I haven't been able to find anything in my field." Patient reports he has been stressed out about becoming an adult and his future plans. Patient reports at times he becomes anxious and nervous. Patient states his sleeping habits are "pretty good"; however, his eating has declined.  Pt denies using any illicit substances or alcohol. A few days ago, patient was in the ED at [facility] under IVC for psychotic symptoms and was admitted for inpatient psychiatric treatment but patient eloped. Patient reports he does not know why he left. Pt reports family hx of MH with his sister and grandmother. Pt reports having no current medical hx. Pt denies SI/HI.   Per Dr. Toni Amend pt is recommended for inpatient psychiatric admission.    Chief Complaint:  Chief Complaint  Patient presents with   Mental Health Problem   Aggressive Behavior   Visit Diagnosis: Schizophreniform disorder    CCA Screening, Triage and Referral (STR)  Patient Reported Information How did you hear about [place]? Legal System  Referral  name: No data recorded Referral phone number: No data recorded  Whom do you see for routine medical problems? No data recorded Practice/Facility Name: No data recorded Practice/Facility Phone Number: No data recorded Name of Contact: No data recorded Contact Number: No data recorded Contact Fax Number: No data recorded Prescriber Name: No data recorded Prescriber Address (if known): No data recorded  What Is the Reason for Your Visit/Call Today? Patient was brought to the ED after having a psychotic episode in public.  How Long Has This Been Causing You Problems? <Week  What Do You Feel Would Help You the Most Today? Medication(s); Treatment for Depression or other mood problem (Assessment)   Have You Recently Been in Any Inpatient Treatment (Hospital/Detox/Crisis Center/28-Day Program)? No data recorded Name/Location of Program/Hospital:No data recorded How Long Were You There? No data recorded When Were You Discharged? No data recorded  Have You Ever Received Services From [facility] Before? No data recorded Who Do You See at [facility]? No data recorded  Have You Recently Had Any Thoughts About Hurting Yourself? No  Are You Planning to Commit Suicide/Harm Yourself At This time? No   Have you Recently Had Thoughts About Hurting Someone Karolee Ohs? No  Explanation: No data recorded  Have You Used Any Alcohol or Drugs in the Past 24 Hours? No  How Long Ago Did You Use Drugs or Alcohol? No data recorded What Did You Use and How Much? No data recorded  Do You Currently Have a Therapist/Psychiatrist? No  Name of Therapist/Psychiatrist:  No data recorded  Have You Been Recently Discharged From Any Office Practice or Programs? No  Explanation of Discharge From Practice/Program: No data recorded    CCA Screening Triage Referral Assessment Type of Contact: Face-to-Face  Is this Initial or Reassessment? Initial Assessment  Date Telepsych consult ordered in [facility]:   11/07/20  Time Telepsych consult ordered in [facility]:  2316   Patient Reported Information Reviewed? Yes  Patient Left Without Being Seen? No data recorded Reason for Not Completing Assessment: No data recorded  Collateral Involvement: None provided   Does Patient Have a Court Appointed Legal Guardian? No data recorded Name and Contact of Legal Guardian: No data recorded If Minor and Not Living with Parent(s), Who has Custody? n/a  Is CPS involved or ever been involved? Never  Is APS involved or ever been involved? Never   Patient Determined To Be At Risk for Harm To Self or Others Based on Review of Patient Reported Information or Presenting Complaint? No  Method: No data recorded Availability of Means: No data recorded Intent: No data recorded Notification Required: No data recorded Additional Information for Danger to Others Potential: No data recorded Additional Comments for Danger to Others Potential: No data recorded Are There Guns or Other Weapons in Your Home? No data recorded Types of Guns/Weapons: No data recorded Are These Weapons Safely Secured?                            No data recorded Who Could Verify You Are Able To Have These Secured: No data recorded Do You Have any Outstanding Charges, Pending Court Dates, Parole/Probation? No data recorded Contacted To Inform of Risk of Harm To Self or Others: No data recorded  Location of Assessment: [facility] ED   Does Patient Present under Involuntary Commitment? Yes  IVC Papers Initial File Date: 11/12/20   [place] of Residence: [place]   Patient Currently Receiving the Following Services: Not Receiving Services   Determination of Need: Urgent (48 hours)   Options For Referral: Inpatient Hospitalization     CCA Biopsychosocial Intake/Chief Complaint:  IVC; paranoid delusions, responding to internal stimuli  Current Symptoms/Problems: "I was mad. I felt like people was after me"   Patient Reported  Schizophrenia/Schizoaffective Diagnosis in Past: Yes   Strengths: Communication  Preferences: None noted  Abilities: Attends to activities of daily living   Type of Services Patient Feels are Needed: Pt unable to identify any services needed.   Initial Clinical Notes/Concerns: No data recorded  Mental Health Symptoms Depression:   Increase/decrease in appetite; Change in energy/activity   Duration of Depressive symptoms: No data recorded  Mania:   None   Anxiety:    Difficulty concentrating   Psychosis:   Hallucinations   Duration of Psychotic symptoms:  Greater than six months   Trauma:   None   Obsessions:   None   Compulsions:   None   Inattention:   None   Hyperactivity/Impulsivity:   None   Oppositional/Defiant Behaviors:   None   Emotional Irregularity:   Potentially harmful impulsivity   Other Mood/Personality Symptoms:  No data recorded   Mental Status Exam Appearance and self-care  Stature:   Tall   Weight:   Average weight   Clothing:   Neat/clean   Grooming:   Normal   Cosmetic use:   None   Posture/gait:   Slumped   Motor activity:   Not Remarkable   Sensorium  Attention:  Normal   Concentration:   Normal   Orientation:   X5   Recall/memory:   Normal   Affect and Mood  Affect:   Appropriate; Anxious   Mood:   Anxious   Relating  Eye contact:   Normal   Facial expression:   Responsive   Attitude toward examiner:   Cooperative   Thought and Language  Speech flow:  Clear and Coherent   Thought content:   Appropriate to Mood and Circumstances   Preoccupation:   None   Hallucinations:   Auditory   Organization:  No data recorded  [employer] of Knowledge:   Average   Intelligence:   Average   Abstraction:   Normal   Judgement:   Impaired   Reality Testing:   Distorted   Insight:   Lacking   Decision Making:   Impulsive   Social Functioning  Social  Maturity:   Impulsive   Social Judgement:   Naive   Stress  Stressors:   Work; Other (Comment) (Life stressors; growing up as young adult; trying to find his way)   Coping Ability:   Deficient supports   Skill Deficits:   Decision making   Supports:   Family; Church     Religion:    Leisure/Recreation: Leisure / Recreation Leisure and Hobbies: Loves to write poetry and read  Exercise/Diet: Exercise/Diet Do You Follow a Special Diet?: No Do You Have Any Trouble Sleeping?: No Explanation of Sleeping Difficulties: Patient reports sleeping 5-6 hours   CCA Employment/Education Employment/Work Situation: Employment / Work Situation Employment Situation: Unemployed Patient's Job has Been Impacted by Current Illness: No Has Patient ever Been in Equities trader?: No  Education: Education Is Patient Currently Attending School?: No Did You 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   CCA Family/Childhood History Family and Relationship History: Family history Marital status: Single Does patient have children?: No  Childhood History:     Child/Adolescent Assessment:     CCA Substance Use Alcohol/Drug Use: Alcohol / Drug Use Pain Medications: See MAR Prescriptions: See MAR Over the Counter: See MAR History of alcohol / drug use?: No history of alcohol / drug abuse                         ASAM's:  Six Dimensions of Multidimensional Assessment  Dimension 1:  Acute Intoxication and/or Withdrawal Potential:      Dimension 2:  Biomedical Conditions and Complications:      Dimension 3:  Emotional, Behavioral, or Cognitive Conditions and Complications:     Dimension 4:  Readiness to Change:     Dimension 5:  Relapse, Continued use, or Continued Problem Potential:     Dimension 6:  Recovery/Living Environment:     ASAM Severity Score:     ASAM Recommended Level of Treatment:     Substance use Disorder (SUD)    Recommendations for Services/Supports/Treatments:    DSM5 Diagnoses: Patient Active Problem List   Diagnosis Date Noted   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

## 2020-11-12 NOTE — ED Provider Notes (Signed)
Emergency Medicine Observation Re-evaluation Note  Matthew Munoz is a 25 y.o. male, seen on rounds today.   Physical Exam  BP 136/88 (BP Location: Left Arm)   Pulse (!) 58   Temp 98.1 F (36.7 C) (Oral)   Resp 14   Ht 6\' 4"  (1.93 m)   Wt 77.1 kg   SpO2 100%   BMI 20.69 kg/m  Physical Exam General: no acute distress Lungs: equal chest rise Psych: calm  ED Course / MDM  EKG:   I have reviewed the labs performed to date as well as medications administered while in observation.  Recent changes in the last 24 hours include none.  Plan  Current plan is for inpatient. is under involuntary commitment.      Wendi Snipes, MD 11/12/20 [phone]

## 2020-11-12 NOTE — ED Notes (Signed)
Pt given lunch tray.

## 2020-11-12 NOTE — ED Notes (Signed)
Pt took shower, linens changed, and scrubs changed.

## 2020-11-12 NOTE — BH Assessment (Signed)
Patient is to be admitted to [facility] BMU tonight 11/12/20 after 7:30pm by Dr.  Neale Burly .  Attending Physician will be Dr.  Toni Amend .   Patient has not been assigned a room by [facility] Charge Nurse, Marchelle Folks.     ER staff is aware of the admission: Glenda, ER Secretary   Dr. Sidney Ace, ER MD  Chrissie Noa, Patient's Nurse  Fleet Contras, Patient Access.

## 2020-11-12 NOTE — Plan of Care (Signed)
Patient new to the unit tonight, hasn't had time to progress  Problem: Education: Goal: Knowledge of [facility] Education information/materials will improve Outcome: Not Progressing Goal: Emotional status will improve Outcome: Not Progressing Goal: Mental status will improve Outcome: Not Progressing Goal: Verbalization of understanding the information provided will improve Outcome: Not Progressing   Problem: Activity: Goal: Interest or engagement in activities will improve Outcome: Not Progressing Goal: Sleeping patterns will improve Outcome: Not Progressing   Problem: Coping: Goal: Ability to verbalize frustrations and anger appropriately will improve Outcome: Not Progressing Goal: Ability to demonstrate self-control will improve Outcome: Not Progressing

## 2020-11-12 NOTE — ED Notes (Signed)
Gave breakfast trays with juice.

## 2020-11-12 NOTE — Progress Notes (Signed)
Patient admitted from [facility], [place], report received from [place], [place]. Pt has a bandage on second, third and 4th digit of Left foot from where he cut it when he was running from [employer]. Pt also has some non-pitting edema to the Left foot. Patient denies SI/HI/AVH but did state he was hearing voices until today.Patient given education, support, and encouragement to be active in his treatment plan. Pt oriented to the unit and his room. Pt didn't have any medications scheduled tonight. Pt being monitored Q 15 minutes for safety per unit protocol. Pt remains safe on the unit.

## 2020-11-12 NOTE — ED Notes (Signed)
IVC/Inpatient admit after 7pm tonight

## 2020-11-12 NOTE — ED Notes (Signed)
Pt given two warm blankets per pt request. Pt informed that we were not going to keep giving him warm blankets when his blankets gets cold.

## 2020-11-12 NOTE — Consult Note (Signed)
[facility] Face-to-Face Psychiatry Consult   Reason for Consult: Consult for 25 year old man brought to the emergency room after becoming psychotic and agitated in public Referring Physician: Katrinka Blazing Patient Identification: Matthew Munoz MRN:  [id number] Principal Diagnosis: Schizophreniform disorder [facility]) Diagnosis:  Principal Problem:   Schizophreniform disorder (HCC) Active Problems:   Psychosis (HCC)   Total Time spent with patient: 1 hour  Subjective:   JONNATAN HANNERS is a 25 y.o. male patient admitted with "I was running from myself".  HPI: Patient seen chart reviewed.  25 year old man brought to the emergency room after becoming psychotic in public.  Patient was cooperative and pleasant during the interview.  He reports that yesterday he was going to church riding along with his grandmother.  When he got there he decided that his clothes were inappropriate and wanted to go back home.  He started walking back towards the home but along the way said that his mind started feeling funny and he started to "run away from myself".  He mentions this phrase several times but was not able to give a clear definition of it.  In any case what he did was to strip all of his clothing off and [place] and [place] to steal a bicycle.  Eventually police picked him up and took him to church to check in with his family and then brought him to the hospital.  Patient reports that he does occasionally hear of auditory hallucinations and also has been having spells of feeling paranoid like people are out to get him.  He describes these episodes he is having as being intermittent spells but admits they are getting more frequent recently.  He was in the emergency room at [facility] this past Thursday also with psychotic symptoms.  He was under IVC at that time and was meant for admission but eloped from the emergency room.  Denies drinking alcohol or using any recreational drugs.  Says  that he sleeps well but when he defines well he says it is about 5 hours a night.  He is living in [place] with his grandmother.  Patient has a college degree but is not currently working and seems somewhat aimless.  Not on any psychiatric medicine.  Denies suicidal or homicidal thought.  Past Psychiatric History: Patient has been seen twice before this summer once in May and once just a couple days ago and emergency room is also with psychotic symptoms.  This seems to be a fairly new onset thing over say the last 3 or 4 months.  Not so far getting any specific treatment for it.  No history of suicide attempts or violence.  Risk to Self:   Risk to Others:   Prior Inpatient Therapy:   Prior Outpatient Therapy:    Past Medical History: History reviewed. No pertinent past medical history. History reviewed. No pertinent surgical history. Family History: No family history on file. Family Psychiatric  History: Patient says his sister has "something similar to me".  Cannot define it any better than that.  Also says his grandmother has depression. Social History:  Social History   Substance and Sexual Activity  Alcohol Use Never     Social History   Substance and Sexual Activity  Drug Use Never    Social History   Socioeconomic History   Marital status: Single    Spouse name: Not on file   Number of children: Not on file   Years of education: Not  on file   Highest education level: Not on file  Occupational History   Not on file  Tobacco Use   Smoking status: Never   Smokeless tobacco: Never  Substance and Sexual Activity   Alcohol use: Never   Drug use: Never   Sexual activity: Not on file  Other Topics Concern   Not on file  Social History Narrative   Not on file   Social Determinants of Health   Financial Resource Strain: Not on file  Food Insecurity: Not on file  Transportation Needs: Not on file  Physical Activity: Not on file  Stress: Not on file  Social  Connections: Not on file   Additional Social History:    Allergies:   Allergies  Allergen Reactions   Avocado Swelling    Labs:  Results for orders placed or performed during the hospital encounter of 11/11/20 (from the past 48 hour(s))  Comprehensive metabolic panel     Status: Abnormal   Collection Time: 11/11/20  4:54 PM  Result Value Ref Range   Sodium 139 135 - 145 mmol/L   Potassium 3.6 3.5 - 5.1 mmol/L   Chloride 106 98 - 111 mmol/L   CO2 25 22 - 32 mmol/L   Glucose, Bld 95 70 - 99 mg/dL    Comment: Glucose reference range applies only to samples taken after fasting for at least 8 hours.   BUN 10 6 - 20 mg/dL   Creatinine, Ser 4.94 0.61 - 1.24 mg/dL   Calcium 9.2 8.9 - 49.6 mg/dL   Total Protein 7.8 6.5 - 8.1 g/dL   Albumin 4.3 3.5 - 5.0 g/dL   AST 36 15 - 41 U/L   ALT 25 0 - 44 U/L   Alkaline Phosphatase 50 38 - 126 U/L   Total Bilirubin 2.0 (H) 0.3 - 1.2 mg/dL   GFR, Estimated >75 >91 mL/min    Comment: (NOTE) Calculated using the CKD-EPI Creatinine Equation (2021)    Anion gap 8 5 - 15    Comment: Performed at [facility], [address] [place], [place] [address]  Ethanol     Status: None   Collection Time: 11/11/20  4:54 PM  Result Value Ref Range   Alcohol, Ethyl (B) <10 <10 mg/dL    Comment: (NOTE) Lowest detectable limit for serum alcohol is 10 mg/dL.  For medical purposes only. Performed at [facility], [address] [place], [place] [address]   Salicylate level     Status: Abnormal   Collection Time: 11/11/20  4:54 PM  Result Value Ref Range   Salicylate Lvl <7.0 (L) 7.0 - 30.0 mg/dL    Comment: Performed at [facility], [address] [place], [place] [address]  Acetaminophen level     Status: Abnormal   Collection Time: 11/11/20  4:54 PM  Result Value Ref Range   Acetaminophen (Tylenol), Serum <10 (L) 10 - 30 ug/mL    Comment: (NOTE) Therapeutic concentrations vary significantly. A range of 10-30 ug/mL   may be an effective concentration for many patients. However, some  are best treated at concentrations outside of this range. Acetaminophen concentrations >150 ug/mL at 4 hours after ingestion  and >50 ug/mL at 12 hours after ingestion are often associated with  toxic reactions.  Performed at [facility], [address] [place], [place] [address]   cbc     Status: Abnormal   Collection Time: 11/11/20  4:54 PM  Result Value Ref Range  WBC 6.3 4.0 - 10.5 K/uL   RBC 3.95 (L) 4.22 - 5.81 MIL/uL   Hemoglobin 13.3 13.0 - 17.0 g/dL   HCT 33.2 95.1 - 88.4 %   MCV 99.0 80.0 - 100.0 fL   MCH 33.7 26.0 - 34.0 pg   MCHC 34.0 30.0 - 36.0 g/dL   RDW 16.6 06.3 - 01.6 %   Platelets 229 150 - 400 K/uL   nRBC 0.0 0.0 - 0.2 %    Comment: Performed at [facility], [address], [place], [place] [address]  Urine Drug Screen, Qualitative     Status: None   Collection Time: 11/11/20  4:54 PM  Result Value Ref Range   Tricyclic, Ur Screen NONE DETECTED NONE DETECTED   Amphetamines, Ur Screen NONE DETECTED NONE DETECTED   MDMA (Ecstasy)Ur Screen NONE DETECTED NONE DETECTED   Cocaine Metabolite,Ur Power NONE DETECTED NONE DETECTED   Opiate, Ur Screen NONE DETECTED NONE DETECTED   Phencyclidine (PCP) Ur S NONE DETECTED NONE DETECTED   Cannabinoid 50 Ng, Ur [place] NONE DETECTED NONE DETECTED   Barbiturates, Ur Screen NONE DETECTED NONE DETECTED   Benzodiazepine, Ur Scrn NONE DETECTED NONE DETECTED   Methadone Scn, Ur NONE DETECTED NONE DETECTED    Comment: (NOTE) Tricyclics + metabolites, urine    Cutoff 1000 ng/mL Amphetamines + metabolites, urine  Cutoff 1000 ng/mL MDMA (Ecstasy), urine              Cutoff 500 ng/mL Cocaine Metabolite, urine          Cutoff 300 ng/mL Opiate + metabolites, urine        Cutoff 300 ng/mL Phencyclidine (PCP), urine         Cutoff 25 ng/mL Cannabinoid, urine                 Cutoff 50 ng/mL Barbiturates + metabolites, urine  Cutoff 200  ng/mL Benzodiazepine, urine              Cutoff 200 ng/mL Methadone, urine                   Cutoff 300 ng/mL  The urine drug screen provides only a preliminary, unconfirmed analytical test result and should not be used for non-medical purposes. Clinical consideration and professional judgment should be applied to any positive drug screen result due to possible interfering substances. A more specific alternate chemical method must be used in order to obtain a confirmed analytical result. Gas chromatography / mass spectrometry (GC/MS) is the preferred confirm atory method. Performed at [facility], [address] [place], [place] [address]   Resp Panel by RT-PCR (Flu A&B, Covid) Nasopharyngeal Swab     Status: None   Collection Time: 11/12/20 12:43 AM   Specimen: Nasopharyngeal Swab; Nasopharyngeal(NP) swabs in vial transport medium  Result Value Ref Range   SARS Coronavirus 2 by RT PCR NEGATIVE NEGATIVE    Comment: (NOTE) SARS-CoV-2 target nucleic acids are NOT DETECTED.  The SARS-CoV-2 RNA is generally detectable in upper respiratory specimens during the acute phase of infection. The lowest concentration of SARS-CoV-2 viral copies this assay can detect is 138 copies/mL. A negative result does not preclude SARS-Cov-2 infection and should not be used as the sole basis for treatment or other patient management decisions. A negative result may occur with  improper specimen collection/handling, submission of specimen other than nasopharyngeal swab, presence of viral mutation(s) within the areas targeted by this assay, and inadequate number of viral copies(<138  copies/mL). A negative result must be combined with clinical observations, patient history, and epidemiological information. The expected result is Negative.  Fact Sheet for Patients:  BloggerCourse.comhttps://www.fda.gov/media/152166/download  Fact Sheet for Healthcare Providers:   SeriousBroker.ithttps://www.fda.gov/media/152162/download  This test is no t yet approved or cleared by the [place] FDA and  has been authorized for detection and/or diagnosis of SARS-CoV-2 by FDA under an Emergency Use Authorization (EUA). This EUA will remain  in effect (meaning this test can be used) for the duration of the COVID-19 declaration under Section 564(b)(1) of the Act, 21 U.S.C.section 360bbb-3(b)(1), unless the authorization is terminated  or revoked sooner.       Influenza A by PCR NEGATIVE NEGATIVE   Influenza B by PCR NEGATIVE NEGATIVE    Comment: (NOTE) The Xpert Xpress SARS-CoV-2/FLU/RSV plus assay is intended as an aid in the diagnosis of influenza from Nasopharyngeal swab specimens and should not be used as a sole basis for treatment. Nasal washings and aspirates are unacceptable for Xpert Xpress SARS-CoV-2/FLU/RSV testing.  Fact Sheet for Patients: BloggerCourse.comhttps://www.fda.gov/media/152166/download  Fact Sheet for Healthcare Providers: SeriousBroker.ithttps://www.fda.gov/media/152162/download  This test is not yet approved or cleared by the [place] FDA and has been authorized for detection and/or diagnosis of SARS-CoV-2 by FDA under an Emergency Use Authorization (EUA). This EUA will remain in effect (meaning this test can be used) for the duration of the COVID-19 declaration under Section 564(b)(1) of the Act, 21 U.S.C. section 360bbb-3(b)(1), unless the authorization is terminated or revoked.  Performed at [facility], [address] [place], [place] [id number]     Current Facility-Administered Medications  Medication Dose Route Frequency Provider Last Rate Last Admin   bacitracin ointment   Topical Once Willy Eddyobinson, Patrick, MD       No current outpatient medications on file.    Musculoskeletal: Strength & Muscle Tone: within normal limits Gait & Station: normal Patient leans: N/A            Psychiatric Specialty Exam:  Presentation  General  Appearance: Appropriate for Environment  Eye Contact:Fair  Speech:Clear and Coherent  Speech Volume:Normal  Handedness:Right   Mood and Affect  Mood:Anxious; Labile  Affect:Blunt   Thought Process  Thought Processes:Goal Directed  Descriptions of Associations:Intact  Orientation:Full (Time, Place and Person)  Thought Content:Paranoid Ideation; Scattered  History of Schizophrenia/Schizoaffective disorder:Yes  Duration of Psychotic Symptoms:Greater than six months  Hallucinations:Hallucinations: None  Ideas of Reference:Paranoia; Delusions  Suicidal Thoughts:Suicidal Thoughts: No  Homicidal Thoughts:Homicidal Thoughts: No   Sensorium  Memory:Immediate Fair; Remote Fair  Judgment:Impaired  Insight:Lacking   Executive Functions  Concentration:Fair  Attention Span:Fair  Recall:Fair  Fund of Knowledge:Fair  Language:Fair   Psychomotor Activity  Psychomotor Activity:Psychomotor Activity: Normal   Assets  Assets:Desire for Improvement; Housing; Social Support; Physical Health   Sleep  Sleep:Sleep: Fair   Physical Exam: Physical Exam Vitals and nursing note reviewed.  Constitutional:      Appearance: Normal appearance.  HENT:     Head: Normocephalic and atraumatic.     Mouth/Throat:     Pharynx: Oropharynx is clear.  Eyes:     Pupils: Pupils are equal, round, and reactive to light.  Cardiovascular:     Rate and Rhythm: Normal rate and regular rhythm.  Pulmonary:     Effort: Pulmonary effort is normal.     Breath sounds: Normal breath sounds.  Abdominal:     General: Abdomen is flat.     Palpations: Abdomen is soft.  Musculoskeletal:  General: Normal range of motion.  Skin:    General: Skin is warm and dry.  Neurological:     General: No focal deficit present.     Mental Status: He is alert. Mental status is at baseline.  Psychiatric:        Attention and Perception: Attention normal.        Mood and Affect: Mood normal.         Speech: Speech is delayed.        Behavior: Behavior is cooperative.        Thought Content: Thought content is paranoid. Thought content does not include homicidal or suicidal ideation.        Cognition and Memory: Memory is impaired.        Judgment: Judgment is impulsive.   Review of Systems  Constitutional: Negative.   HENT: Negative.    Eyes: Negative.   Respiratory: Negative.    Cardiovascular: Negative.   Gastrointestinal: Negative.   Musculoskeletal: Negative.   Skin: Negative.   Neurological: Negative.   Psychiatric/Behavioral:  Positive for hallucinations. Negative for depression, memory loss, substance abuse and suicidal ideas. The patient is nervous/anxious and has insomnia.   Blood pressure (!) 132/94, pulse (!) 56, temperature 98.1 F (36.7 C), temperature source Oral, resp. rate 16, height  (1.93 m), weight 77.1 kg, SpO2 100 %. Body mass index is 20.69 kg/m.  Treatment Plan Summary: Medication management and Plan this is a 25 year old man who appears to be having a new onset psychotic disorder over the last several months.  Could be bipolar could be schizophrenia unclear at this point.  Arguing in favor of bipolar would be that it has always involved stripping his clothes off that he is not sleeping well that his affect seems upbeat on the other hand he seems somewhat confused and blunted in his thinking today and is having hallucinations.  We will call at schizophreniform disorder for the time being.  Tentative diagnosis.  Recommend admission to the psychiatric ward.  Patient is agreeable and understanding.  Labs reviewed.  Orders will be placed in case will be discussed with treatment team downstairs.  Disposition: Recommend psychiatric Inpatient admission when medically cleared.  Mordecai Rasmussen, MD 11/12/2020 1:27 PM

## 2020-11-12 NOTE — ED Notes (Signed)
Dr.Clapacs at bedside  

## 2020-11-13 DIAGNOSIS — F203 Undifferentiated schizophrenia: Principal | ICD-10-CM

## 2020-11-13 LAB — LIPID PANEL
Cholesterol: 135 mg/dL (ref 0–200)
HDL: 63 mg/dL (ref >40)
LDL Cholesterol: 59 mg/dL (ref 0–99)
Total CHOL/HDL Ratio: 2.1 RATIO
Triglycerides: 65 mg/dL (ref <150)
VLDL: 13 mg/dL (ref 0–40)

## 2020-11-13 LAB — TSH: TSH: 3.474 u[IU]/mL (ref 0.350–4.500)

## 2020-11-13 LAB — HEMOGLOBIN A1C
Hgb A1c MFr Bld: 4.8 % (ref 4.8–5.6)
Mean Plasma Glucose: 91.06 mg/dL

## 2020-11-13 MED ORDER — OLANZAPINE 5 MG PO TBDP: 5.0000 mg | ORAL_TABLET | Freq: Every day | ORAL | Status: DC

## 2020-11-13 MED ORDER — ZIPRASIDONE MESYLATE 20 MG IM SOLR: 20.0000 mg | Freq: Four times a day (QID) | INTRAMUSCULAR | Status: DC | PRN

## 2020-11-13 MED ORDER — ARIPIPRAZOLE 10 MG PO TABS
10.0000 mg | ORAL_TABLET | Freq: Every day | ORAL | Status: DC
  Administered 2020-11-13 – 2020-11-16 (×4): 10 mg via ORAL
  Filled 2020-11-13 (×4): qty 1

## 2020-11-13 MED ORDER — OLANZAPINE 5 MG PO TBDP: 10.0000 mg | ORAL_TABLET | Freq: Four times a day (QID) | ORAL | Status: DC | PRN

## 2020-11-13 MED ORDER — LISINOPRIL 5 MG PO TABS
5.0000 mg | ORAL_TABLET | Freq: Every day | ORAL | Status: DC
  Administered 2020-11-13 – 2020-11-16 (×4): 5 mg via ORAL
  Filled 2020-11-13 (×4): qty 1

## 2020-11-13 NOTE — BHH Suicide Risk Assessment (Signed)
[facility] Admission Suicide Risk Assessment   Nursing information obtained from:  Patient Demographic factors:  NA Current Mental Status:  NA Loss Factors:  NA Historical Factors:  NA Risk Reduction Factors:  NA  Total Time spent with patient: 1 hour Principal Problem: Undifferentiated schizophrenia (HCC) Diagnosis:  Principal Problem:   Undifferentiated schizophrenia (HCC) Active Problems:   Acute psychosis (HCC)  Subjective Data: Patient admitted for acute psychosis and agitation in public. See H&P for details.   Continued Clinical Symptoms:  Alcohol Use Disorder Identification Test Final Score (AUDIT): 0 The "Alcohol Use Disorders Identification Test", Guidelines for Use in Primary Care, Second Edition.  World Science writer [facility]). Score between 0-7:  no or low risk or alcohol related problems. Score between 8-15:  moderate risk of alcohol related problems. Score between 16-19:  high risk of alcohol related problems. Score 20 or above:  warrants further diagnostic evaluation for alcohol dependence and treatment.   CLINICAL FACTORS:   Severe Anxiety and/or Agitation Schizophrenia:   Paranoid or undifferentiated type Currently Psychotic Unstable or Poor Therapeutic Relationship Previous Psychiatric Diagnoses and Treatments   Musculoskeletal: Strength & Muscle Tone: within normal limits Gait & Station: normal Patient leans: N/A  Psychiatric Specialty Exam:  Presentation  General Appearance: Appropriate for Environment  Eye Contact:Fair  Speech:Clear and Coherent  Speech Volume:Normal  Handedness:Right   Mood and Affect  Mood:Anxious  Affect:Congruent   Thought Process  Thought Processes:Goal Directed  Descriptions of Associations:Intact  Orientation:Full (Time, Place and Person)  Thought Content:Paranoid Ideation; Scattered  History of Schizophrenia/Schizoaffective disorder:Yes  Duration of Psychotic Symptoms:Greater than six  months  Hallucinations:Hallucinations: Auditory  Ideas of Reference:Delusions; Paranoia  Suicidal Thoughts:Suicidal Thoughts: No  Homicidal Thoughts:Homicidal Thoughts: No   Sensorium  Memory:Immediate Fair; Recent Fair; Remote Fair  Judgment:Impaired  Insight:Lacking   Executive Functions  Concentration:Fair  Attention Span:Fair  Recall:Fair  Fund of Knowledge:Fair  Language:Fair   Psychomotor Activity  Psychomotor Activity:Psychomotor Activity: Normal   Assets  Assets:Desire for Improvement; Social Support; Physical Health   Sleep  Sleep:Sleep: Good Number of Hours of Sleep: 7    Physical Exam: Physical Exam ROS Blood pressure (!) 142/93, pulse 64, temperature 98.6 F (37 C), temperature source Oral, resp. rate 18, height 6\' 4"  (1.93 m), weight 77.1 kg, SpO2 100 %. Body mass index is 20.69 kg/m.   COGNITIVE FEATURES THAT CONTRIBUTE TO RISK:  Closed-mindedness and Loss of executive function    SUICIDE RISK:   Mild:  Suicidal ideation of limited frequency, intensity, duration, and specificity.  There are no identifiable plans, no associated intent, mild dysphoria and related symptoms, good self-control (both objective and subjective assessment), few other risk factors, and identifiable protective factors, including available and accessible social support.  PLAN OF CARE: Continue inpatient admission, see H&P for details.   I certify that inpatient services furnished can reasonably be expected to improve the patient's condition.   , MD 11/13/2020, 1:06 PM

## 2020-11-13 NOTE — H&P (Signed)
Psychiatric Admission Assessment Adult  Patient Identification: Matthew Munoz MRN:  [id number] Date of Evaluation:  11/13/2020 Chief Complaint:  Acute psychosis (HCC) [F23] Schizophreniform disorder (HCC) [F20.81] Principal Diagnosis: Undifferentiated schizophrenia (HCC) Diagnosis:  Principal Problem:   Undifferentiated schizophrenia (HCC) Active Problems:   Acute psychosis (HCC) CC: "The voices were telling me different things like stay away and go there." History of Present Illness: Matthew Munoz is a 25 year old male who was brought to the emergency room involuntarily after becoming psychotic in public.  He was walking to his house to get clothes and started running through the [place] and [place] without any clothing on.  Patient was seen and interviewed this morning.  Chart notes and labs were reviewed.  CT scan of head was normal.  U tox negative, TSH normal.  Patient is very pleasant, alert and oriented to person, place, time, and situation.  He speaks in linear sentences and is coherent.  He admits to events above and as described in the emergency room department consult note.  He tells me that this has never happened to him before.  He states that he graduated from [employer] in 2021 with a degree in liberal arts.  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  He took other odd jobs and at one point became homeless for 2 weeks.  He states that his family is a good support system, but he was "running away to try to do it on my own."  Patient states he was not listening to his mother, or his grandmother, whom he lives with.  They were pushing him to get a job.  Patient states that there was no precipitating event prior to this episode where he was naked in public.  He states that he has not been under any particular stress.  Patient states that he played football in college and worked at [employer] on and off.  He enjoys creative  activities, like writing children's books and poems.  He would like to get some "gigs" doing modeling.  He states that he is "broke as a joke right now."  He says he will be looking for a full-time job and get insurance.  Patient denies suicidal or homicidal ideations.  He denies current auditory or visual hallucinations.  He denies depression.  Patient states that he is willing to take medication to help with these thoughts of feeling like he is being chased and doing things that are out of his normal behavior.  Patient states that this is never happened before.  He tells me that his sister had a similar incident about a year ago, when she was 37.  Patient says he was living with his maternal grandmother, then he was living in his car.  He endorses very good family support.  Spoke with patient again after contact with mother regarding mitral valve regurgitation.  Patient agrees that he was diagnosed with it but he "cured himself of it."  Discussed with patient the importance that we continue that and he agreed.  He questioned the medication for psychotic thoughts after we discussed at this morning.  Patient states that he will take it.   Patient denies any type illicit drug use recently.  Denies having smoked or ingested anything.   Collateral from the mother, Matthew Munoz, [phone], with verbal permission from patient. 6-8 months he has not been himself. Acting like life was going to end. Rush to  make amends "I got to get everything right." Not sure what triggered this-out of the blue." Then back to himself for a short time. Starting doing meditation. [place] SpanielSaid he was one with nature, super powers, energy from the sun. Got mad at family because they didn't believe him and were had concerns.  Felt like mom against him because they didn't believe him. Tuned on everyone, "you are witches".  They were trying to poison him, would not eat anybody's cooking.  Became very paranoid.  Family started feeling.  They become feeling threatened.  Patient approached a man who was in a car with his family in the [employer] [facility] parking lot and started threatening him saying "why are you following me."  The man called the police and patient was arrested.  Mom states that patient had never gotten in trouble with the law before.  Patient was placed on a 24-hour hold when mom took out IVC.  This was about 2 or 3 months ago according to mom and patient was at [facility] in [place].  Mother tried to pick him up from there but he had already left and has just been bouncing around.  Mom verifies that he gradauted from [employer] in 2021.  He has bounced from job to job since.  Sister was diagnosed with bipolar illness with psychosis about a year ago, at 24 years of age.  Sister does not live with mom, but mom believes she is on medication and doing well.  Patient's maternal grandmother also with severe depression, many hospitalizations.    Mother states patient is always welcome to live with her in [place], but he never wants to come there.  She is not sure that he will be able to go back and live with maternal grandmother, as this incident has frightened her.    Mother stated that patient was diagnosed with mitral valve regurgitation in 2016 and was given a prescription for a "single digit low dose of lisinopril once daily."  Patient told me that he stopped taking it, not remembering when it was.  But he said he thought he could cure himself.  He is amenable to starting this medication.  Will discuss with Dr. Neale BurlyFreeman. Associated Signs/Symptoms: Depression Symptoms:   Denies Duration of Depression Symptoms: No data recorded (Hypo) Manic Symptoms:  Delusions, Distractibility, Hallucinations, Impulsivity, Irritable Mood, Labiality of Mood, Anxiety Symptoms:   Denies Psychotic Symptoms:  Delusions, Hallucinations: Auditory Command:  "keep running, go there." Visual Ideas of Reference, Paranoia, PTSD Symptoms: Denies Total  Time spent with patient: 1 hour  Past Psychiatric History: Per mother, patient has had bizarre and delusional behaviors over the last 6 to 8 months.  She denies any abnormal behaviors prior to that.  Patient [facility], but apparently a note.  Chart notes also indicate a visit on [facility] where he was IVC by family for bizarre behaviors.  Patient denied any suicidal or homicidal thoughts and was not psychotic at the time and was deemed to be safe for discharge.  Is the patient at risk to self? Yes.    Has the patient been a risk to self in the past 6 months? Yes.    Has the patient been a risk to self within the distant past? No.  Is the patient a risk to others? No.  Has the patient been a risk to others in the past 6 months? No.  Has the patient been  a risk to others within the distant past? No.   Prior Inpatient Therapy:   Prior Outpatient Therapy:    Alcohol Screening: 1. How often do you have a drink containing alcohol?: Never 2. How many drinks containing alcohol do you have on a typical day when you are drinking?: 1 or 2 3. How often do you have six or more drinks on one occasion?: Never AUDIT-C Score: 0 4. How often during the last year have you found that you were not able to stop drinking once you had started?: Never 5. How often during the last year have you failed to do what was normally expected from you because of drinking?: Never 6. How often during the last year have you needed a first drink in the morning to get yourself going after a heavy drinking session?: Never 7. How often during the last year have you had a feeling of guilt of remorse after drinking?: Never 8. How often during the last year have you been unable to remember what happened the night before because you had been drinking?: Never 9. Have you or someone else been injured as a result of your drinking?: No 10. Has a relative or  friend or a doctor or another health worker been concerned about your drinking or suggested you cut down?: No Alcohol Use Disorder Identification Test Final Score (AUDIT): 0 Substance Abuse History in the last 12 months:  No. Consequences of Substance Abuse: NA Previous Psychotropic Medications: No  Psychological Evaluations: No  Past Medical History: History reviewed. No pertinent past medical history. History reviewed. No pertinent surgical history. Family History: History reviewed. No pertinent family history. Family Psychiatric  History: Per report of patient's mother: maternal grandmother with debilitating major depressive disorder.  Sister with bipolar disorder with psychotic features. Tobacco Screening:   Social History:  Social History   Substance and Sexual Activity  Alcohol Use Never     Social History   Substance and Sexual Activity  Drug Use Never    Additional Social History: Marital status: Single Does patient have children?: No        Allergies:   Allergies  Allergen Reactions   Avocado Swelling   Lab Results:  Results for orders placed or performed during the hospital encounter of 11/12/20 (from the past 48 hour(s))  Hemoglobin A1c     Status: None   Collection Time: 11/13/20  6:50 AM  Result Value Ref Range   Hgb A1c MFr Bld 4.8 4.8 - 5.6 %    Comment: (NOTE) Pre diabetes:          5.7%-6.4%  Diabetes:              >6.4%  Glycemic control for   <7.0% adults with diabetes    Mean Plasma Glucose 91.06 mg/dL    Comment: Performed at [facility], [address], [place], [place] [address]  Lipid panel     Status: None   Collection Time: 11/13/20  6:50 AM  Result Value Ref Range   Cholesterol 135 0 - 200 mg/dL   Triglycerides 65 <981 mg/dL   HDL 63 >19 mg/dL   Total CHOL/HDL Ratio 2.1 RATIO   VLDL 13 0 - 40 mg/dL   LDL Cholesterol 59 0 - 99 mg/dL    Comment:        Total Cholesterol/HDL:CHD Risk Coronary Heart Disease Risk Table  Men   Women  1/2 Average Risk   3.4   3.3  Average Risk       5.0   4.4  2 X Average Risk   9.6   7.1  3 X Average Risk  23.4   11.0        Use the calculated Patient Ratio above and the CHD Risk Table to determine the patient's CHD Risk.        ATP III CLASSIFICATION (LDL):  <100     mg/dL   Optimal  161-096  mg/dL   Near or Above                    Optimal  130-159  mg/dL   Borderline  045-409  mg/dL   High  >811     mg/dL   Very High Performed at [facility], [address] [place], [place] [address]   TSH     Status: None   Collection Time: 11/13/20  6:50 AM  Result Value Ref Range   TSH 3.474 0.350 - 4.500 uIU/mL    Comment: Performed by a 3rd Generation assay with a functional sensitivity of <=0.01 uIU/mL. Performed at [facility], [address] [place], [place] [address]     Blood Alcohol level:  Lab Results  Component Value Date   [facility] <10 11/11/2020   ETH <10 11/07/2020    Metabolic Disorder Labs:  Lab Results  Component Value Date   HGBA1C 4.8 11/13/2020   MPG 91.06 11/13/2020   No results found for: PROLACTIN Lab Results  Component Value Date   CHOL 135 11/13/2020   TRIG 65 11/13/2020   HDL 63 11/13/2020   CHOLHDL 2.1 11/13/2020   VLDL 13 11/13/2020   LDLCALC 59 11/13/2020    Current Medications: Current Facility-Administered Medications  Medication Dose Route Frequency Provider Last Rate Last Admin   acetaminophen (TYLENOL) tablet 650 mg  650 mg Oral Q6H PRN Novella Olive, NP       alum & mag hydroxide-simeth (MAALOX/MYLANTA) 200-200-20 MG/5ML suspension 30 mL  30 mL Oral Q4H PRN Novella Olive, NP       ARIPiprazole (ABILIFY) tablet 10 mg  10 mg Oral Daily Les Pou M, MD   10 mg at 11/13/20 1217   hydrOXYzine (ATARAX/VISTARIL) tablet 50 mg  50 mg Oral Q6H PRN Clapacs, [name], MD       lisinopril (ZESTRIL) tablet 5 mg  5 mg Oral Daily Les Pou M, MD   5 mg at 11/13/20 1217   magnesium hydroxide  (MILK OF MAGNESIA) suspension 30 mL  30 mL Oral Daily PRN Novella Olive, NP       OLANZapine zydis (ZYPREXA) disintegrating tablet 10 mg  10 mg Oral Q6H PRN [name], MD       traZODone (DESYREL) tablet 50 mg  50 mg Oral QHS PRN Novella Olive, NP       ziprasidone (GEODON) injection 20 mg  20 mg Intramuscular Q6H PRN [name], MD       PTA Medications: No medications prior to admission.    Musculoskeletal: Strength & Muscle Tone: within normal limits Gait & Station: normal Patient leans: N/A            Psychiatric Specialty Exam:  Presentation  General Appearance: Appropriate for Environment  Eye Contact:Good  Speech:Clear and Coherent  Speech Volume:Normal  Handedness:Right   Mood and Affect  Mood:Anxious  Affect:Congruent;  Appropriate   Thought Process  Thought Processes:Coherent  Duration of Psychotic Symptoms: Greater than six months  Past Diagnosis of Schizophrenia or Psychoactive disorder: Yes  Descriptions of Associations:Intact  Orientation:Full (Time, Place and Person)  Thought Content:Paranoid Ideation; Scattered  Hallucinations:Hallucinations: None; Auditory; Command (Denies all currently,) Description of Command Hallucinations: Denies currently  Ideas of Reference:Delusions; Paranoia; Percusatory  Suicidal Thoughts:Suicidal Thoughts: No (Denies)  Homicidal Thoughts:Homicidal Thoughts: No (Denies)   Sensorium  Memory:Immediate Good; Recent Good; Remote Poor  Judgment:Fair  Insight:Poor   Executive Functions  Concentration:Good  Attention Span:Good  Recall:Good  Fund of Knowledge:Good  Language:Good   Psychomotor Activity  Psychomotor Activity:Psychomotor Activity: Normal   Assets  Assets:Communication Skills; Housing; Leisure Time; Physical Health; Resilience; Social Support; Talents/Skills; Vocational/Educational   Sleep  Sleep:Sleep: Fair Number of Hours of Sleep: 7    Physical  Exam: Physical Exam Vitals and nursing note reviewed.  HENT:     Head: Normocephalic.     Nose: No congestion or rhinorrhea.  Eyes:     General:        Right eye: No discharge.        Left eye: No discharge.  Cardiovascular:     Rate and Rhythm: Normal rate.  Pulmonary:     Effort: Pulmonary effort is normal.  Musculoskeletal:        General: Normal range of motion.     Cervical back: Normal range of motion.  Skin:    General: Skin is warm and dry.  Neurological:     Mental Status: He is alert and oriented to person, place, and time.  Psychiatric:        Mood and Affect: Mood normal.   Review of Systems  Psychiatric/Behavioral:  Positive for hallucinations (History of, none currently.  Paranoia). Negative for depression, memory loss, substance abuse and suicidal ideas. The patient is nervous/anxious. The patient does not have insomnia.        Patient brought to the hospital for psychotic episode.    All other systems reviewed and are negative. Blood pressure (!) 142/93, pulse 64, temperature 98.6 F (37 C), temperature source Oral, resp. rate 18, height 6\' 4"  (1.93 m), weight 77.1 kg, SpO2 100 %. Body mass index is 20.69 kg/m.  Treatment Plan Summary: Daily contact with patient to assess and evaluate symptoms and progress in treatment and Medication management  Psychosis -Start Abilify 10 mg p.o. daily  Agitation - Start Zyprexa 10 mg disintegrating tablet p.o. every 6 hours as needed -Start Geodon 20 mg IM every 6 hours as needed  Insomnia - Continue trazodone 50 mg oral at bedtime as needed  Anxiety - Continue hydroxyzine 50 mg oral every 6 hours as needed  Mitral valve regurgitation/hypertension - Start lisinopril 5 mg p.o. daily  PRN, Other  --Continue Tylenol 650 mg po every 6 hrs prn pain --Continue MAALOX/MYLANTA 30 mL po every 4 hrs prn indigestion --Continue Milk of Magnesia 30 mL po daily prn constipation     Observation Level/Precautions:  15 minute  checks  Laboratory: Done in the ED  Psychotherapy: Psychosocial groups  Medications: See [facility]  Consultations: As indicated  Discharge Concerns: Stability and safety  Estimated LOS: 3 to 5 days  Other:     Physician Treatment Plan for Primary Diagnosis: Undifferentiated schizophrenia (HCC) Long Term Goal(s): Improvement in symptoms so as ready for discharge  Short Term Goals: Ability to identify changes in lifestyle to reduce recurrence of condition will improve, Ability to verbalize feelings will improve, Ability  to identify and develop effective coping behaviors will improve, Ability to maintain clinical measurements within normal limits will improve, Compliance with prescribed medications will improve, and Ability to identify triggers associated with substance abuse/mental health issues will improve  Physician Treatment Plan for Secondary Diagnosis: Principal Problem:   Undifferentiated schizophrenia (HCC) Active Problems:   Acute psychosis (HCC)  Long Term Goal(s): Improvement in symptoms so as ready for discharge  Short Term Goals: Ability to identify changes in lifestyle to reduce recurrence of condition will improve, Ability to verbalize feelings will improve, Ability to identify and develop effective coping behaviors will improve, Ability to maintain clinical measurements within normal limits will improve, Compliance with prescribed medications will improve, and Ability to identify triggers associated with substance abuse/mental health issues will improve  I certify that inpatient services furnished can reasonably be expected to improve the patient's condition.    Vanetta Mulders, NP 8/9/20221:25 PM

## 2020-11-13 NOTE — Plan of Care (Signed)
Patient denies SI/HI/AVH  Problem: Education: Goal: Emotional status will improve Outcome: Progressing Goal: Mental status will improve Outcome: Progressing   

## 2020-11-13 NOTE — Progress Notes (Signed)
Patient calm and compliant during assessment, denies SI/HI/AVH. Patient stated he had a good day. Pt observed interacting appropriately with staff and peers on the unit. Patient didn't have any medication scheduled tonight and hasn't requested anything PRN. Pt given education, support, and encouragement to be active in his treatment plan. Pt being monitored Q 15 minutes for safety per unit protocol. Pt remains safe on the unit

## 2020-11-13 NOTE — Progress Notes (Signed)
Recreation Therapy Notes  INPATIENT RECREATION TR PLAN  Patient Details Name: ASHAUN GAUGHAN MRN: [id number] DOB: Feb 24, 1996 Today's Date: 11/13/2020  Rec Therapy Plan Is patient appropriate for Therapeutic Recreation?: Yes Treatment times per week: at least 3 Estimated Length of Stay: 5-7 days TR Treatment/Interventions: Group participation (Comment)  Discharge Criteria Pt will be discharged from therapy if:: Discharged Treatment plan/goals/alternatives discussed and agreed upon by:: Patient/family  Discharge Summary     Jozee Hammer 11/13/2020, 11:46 AM

## 2020-11-13 NOTE — [facility] Group Notes (Signed)
LCSW Group Therapy Note  11/13/2020 1:28 PM  Type of Therapy/Topic:  Group Therapy:  Feelings about Diagnosis  Participation Level:  Active   Description of Group:   This group will allow patients to explore their thoughts and feelings about diagnoses they have received. Patients will be guided to explore their level of understanding and acceptance of these diagnoses. Facilitator will encourage patients to process their thoughts and feelings about the reactions of others to their diagnosis and will guide patients in identifying ways to discuss their diagnosis with significant others in their lives. This group will be process-oriented, with patients participating in exploration of their own experiences, giving and receiving support, and processing challenge from other group members.   Therapeutic Goals: Patient will demonstrate understanding of diagnosis as evidenced by identifying two or more symptoms of the disorder Patient will be able to express two feelings regarding the diagnosis Patient will demonstrate their ability to communicate their needs through discussion and/or role play  Summary of Patient Progress: Patient was present for the entirety of the group session. Patient was an active listener and participated in the topic of discussion, Patient was the sole attendee of group session aside from this Clinical research associate,. In conversation with patient, he added nuance to the conversation. Patient shared that he believes that he had awareness of his behaviors prior to hospitalization; he reports that he did so in defiance of his parents. 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 CSW discussed with patient who his mental health affects his witting and professional interest. Patient has recently applied to non-witting jobs to establish him self while looking for jobs within his field of interest.     Therapeutic Modalities:   Cognitive Behavioral Therapy Brief Therapy Feelings Identification    Gwenevere Ghazi, MSW, [place], Bridget Hartshorn 11/13/2020 1:28 PM

## 2020-11-13 NOTE — Progress Notes (Signed)
Patient was educated on his scheduled medications, Abilify and lisinopril. Patient verbalized understanding of the medications and was compliant with medication. However, 5 minutes later, patient rang call bell in his bathroom. When this writer came to patient's room, patient appeared preoccupied. Patient stated that he was only supposed to take the lisinopril and was unhappy he was given the Abilify.
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
## 2020-11-13 NOTE — Progress Notes (Signed)
Recreation Therapy Notes   Date: 11/13/2020  Time: 10:00 am   Location: Craft room  Behavioral response: Appropriate  Intervention Topic: Goals   Discussion/Intervention:  Group content on today was focused on goals. Patients described what goals are and how they define goals. Individuals expressed how they go about setting goals and reaching them. The group identified how important goals are and if they make short term goals to reach long term goals. Patients described how many goals they work on at a time and what affects them not reaching their goal. Individuals described how much time they put into planning and obtaining their goals. The group participated in the intervention "My Goal Board" and made personal goal boards to help them achieve their goal. Clinical Observations/Feedback: Patient came to group and defined goals as plans you make to reach a certain point in your life. He expressed that his short-term goal is to get a job, pay off debt and save money. Participant identified his long-term goal is to get back on his feet. Patient stated that he makes goals by thinking about where he wants to be headed. Individual was social with Clinical research associate while participating in the intervention.   Dajha Urquilla LRT/CTRS           Lavana Huckeba 11/13/2020 11:29 AM

## 2020-11-13 NOTE — Progress Notes (Addendum)
Patient is alert and oriented x4. Patient is calm and cooperative with assessment. Affect and speech is appropriate to situation and logical. He denies suicidal ideations, homicidal ideations, and auditory and visual hallucinations. Patient does not appear to be responding to internal stimuli. He denies depression and anxiety. Patient's only complaint is that he has been having cold sweats at night.  Patient has good insight into what brought him into the hospital. He recalls stripping off his clothes and running, stating that he felt like he was being chased and running away from someone. He states that he realizes that he was running away from himself. He also says that he has felt like he was being chased on multiple occasions and endorses having hallucinations in the past. He says that reading the Bible has helped him. Patient interaction with staff and other patients on the unit is appropriate. Patient remains safe on the unit at this time.

## 2020-11-13 NOTE — Progress Notes (Signed)
Recreation Therapy Notes  INPATIENT RECREATION THERAPY ASSESSMENT  Patient Details Name: Matthew Munoz MRN: [id number] DOB: 1995/12/28 Today's Date: 11/13/2020       Information Obtained From: Patient  Able to Participate in Assessment/Interview: Yes  Patient Presentation: Responsive  Reason for Admission (Per Patient): Active Symptoms  Patient Stressors:    Coping Skills:   Film/video editor, Write, Music, Avoidance, Read, Exercise  Leisure Interests (2+):  Exercise - Walking, Individual - Reading, Individual - Writing, Community - Movies, [employer] - Orthoptist, Sports - Basketball, Games - Clinical cytogeneticist games ([place], Eating, Hydrographic surveyor)  Frequency of Recreation/Participation: Marketing executive Resources:  Yes  Community Resources:  Research scientist (physical sciences)  Current Use: No  If no, Barriers?: Surveyor, quantity, English as a second language teacher  Expressed Interest in [employer] Information: Yes  [employer] of Residence:  [place]  Patient Main Form of Transportation: Therapist, music  Patient Strengths:  Aware,Charismatic, Armed forces logistics/support/administrative officer, Well mannered  Patient Identified Areas of Improvement:  Stay clean body and mind; handle responsibilites  Patient Goal for Hospitalization:  Get my mind together  Current SI (including self-harm):  No  Current HI:  No  Current AVH: No  Staff Intervention Plan: Group Attendance, Collaborate with Interdisciplinary Treatment Team  Consent to Intern Participation: N/A  [name] 11/13/2020, 11:43 AM

## 2020-11-14 DIAGNOSIS — F203 Undifferentiated schizophrenia: Secondary | ICD-10-CM | POA: Diagnosis not present

## 2020-11-14 NOTE — Tx Team (Addendum)
Interdisciplinary Treatment and Diagnostic Plan Update  11/14/2020 Time of Session: 9:00AM [name] MRN: [id number]  Principal Diagnosis: Undifferentiated schizophrenia [facility])  Secondary Diagnoses: Principal Problem:   Undifferentiated schizophrenia (HCC) Active Problems:   Acute psychosis (HCC)   Current Medications:  Current Facility-Administered Medications  Medication Dose Route Frequency Provider Last Rate Last Admin   acetaminophen (TYLENOL) tablet 650 mg  650 mg Oral Q6H PRN Novella Olive, NP       alum & mag hydroxide-simeth (MAALOX/MYLANTA) 200-200-20 MG/5ML suspension 30 mL  30 mL Oral Q4H PRN Novella Olive, NP       ARIPiprazole (ABILIFY) tablet 10 mg  10 mg Oral Daily [name], MD   10 mg at 11/14/20 0805   hydrOXYzine (ATARAX/VISTARIL) tablet 50 mg  50 mg Oral Q6H PRN Clapacs, [name], MD       lisinopril (ZESTRIL) tablet 5 mg  5 mg Oral Daily [name], MD   5 mg at 11/14/20 0654   magnesium hydroxide (MILK OF MAGNESIA) suspension 30 mL  30 mL Oral Daily PRN Novella Olive, NP       OLANZapine zydis (ZYPREXA) disintegrating tablet 10 mg  10 mg Oral Q6H PRN [name], MD       traZODone (DESYREL) tablet 50 mg  50 mg Oral QHS PRN Novella Olive, NP       ziprasidone (GEODON) injection 20 mg  20 mg Intramuscular Q6H PRN [name], MD       PTA Medications: No medications prior to admission.    Patient Stressors: Medication change or noncompliance Other: Patient stressed about his future and finding a job  Patient Strengths: Ability for insight Motivation for treatment/growth  Treatment Modalities: Medication Management, Group therapy, Case management,  1 to 1 session with clinician, Psychoeducation, Recreational therapy.   Physician Treatment Plan for Primary Diagnosis: Undifferentiated schizophrenia (HCC) Long Term Goal(s): Improvement in symptoms so as ready for discharge   Short Term Goals: Ability to identify changes in  lifestyle to reduce recurrence of condition will improve Ability to verbalize feelings will improve Ability to identify and develop effective coping behaviors will improve Ability to maintain clinical measurements within normal limits will improve Compliance with prescribed medications will improve Ability to identify triggers associated with substance abuse/mental health issues will improve  Medication Management: Evaluate patient's response, side effects, and tolerance of medication regimen.  Therapeutic Interventions: 1 to 1 sessions, Unit Group sessions and Medication administration.  Evaluation of Outcomes: Progressing  Physician Treatment Plan for Secondary Diagnosis: Principal Problem:   Undifferentiated schizophrenia (HCC) Active Problems:   Acute psychosis (HCC)  Long Term Goal(s): Improvement in symptoms so as ready for discharge   Short Term Goals: Ability to identify changes in lifestyle to reduce recurrence of condition will improve Ability to verbalize feelings will improve Ability to identify and develop effective coping behaviors will improve Ability to maintain clinical measurements within normal limits will improve Compliance with prescribed medications will improve Ability to identify triggers associated with substance abuse/mental health issues will improve     Medication Management: Evaluate patient's response, side effects, and tolerance of medication regimen.  Therapeutic Interventions: 1 to 1 sessions, Unit Group sessions and Medication administration.  Evaluation of Outcomes: Progressing   RN Treatment Plan for Primary Diagnosis: Undifferentiated schizophrenia (HCC) Long Term Goal(s): Knowledge of disease and therapeutic regimen to maintain health will improve  Short Term Goals: Ability to demonstrate self-control, Ability to participate in decision  making will improve, Ability to verbalize feelings will improve, Ability to disclose and discuss suicidal  ideas, Ability to identify and develop effective coping behaviors will improve, and Compliance with prescribed medications will improve  Medication Management: RN will administer medications as ordered by provider, will assess and evaluate patient's response and provide education to patient for prescribed medication. RN will report any adverse and/or side effects to prescribing provider.  Therapeutic Interventions: 1 on 1 counseling sessions, Psychoeducation, Medication administration, Evaluate responses to treatment, Monitor vital signs and CBGs as ordered, Perform/monitor CIWA, COWS, AIMS and Fall Risk screenings as ordered, Perform wound care treatments as ordered.  Evaluation of Outcomes: Progressing   LCSW Treatment Plan for Primary Diagnosis: Undifferentiated schizophrenia (HCC) Long Term Goal(s): Safe transition to appropriate next level of care at discharge, Engage patient in therapeutic group addressing interpersonal concerns.  Short Term Goals: Engage patient in aftercare planning with referrals and resources, Increase social support, Increase ability to appropriately verbalize feelings, Increase emotional regulation, Facilitate acceptance of mental health diagnosis and concerns, and Increase skills for wellness and recovery  Therapeutic Interventions: Assess for all discharge needs, 1 to 1 time with Social worker, Explore available resources and support systems, Assess for adequacy in community support network, Educate family and significant other(s) on suicide prevention, Complete Psychosocial Assessment, Interpersonal group therapy.  Evaluation of Outcomes: Progressing   Progress in Treatment: Attending groups: Yes. Participating in groups: Yes. Taking medication as prescribed: Yes. Toleration medication: Yes. Family/Significant other contact made: Yes, individual(s) contacted:  SPE completed with the patient's mother. Patient understands diagnosis: Yes. Discussing patient  identified problems/goals with staff: Yes. Medical problems stabilized or resolved: Yes. Denies suicidal/homicidal ideation: Yes. Issues/concerns per patient self-inventory: No. Other: none  New problem(s) identified: No, Describe:  none  New Short Term/Long Term Goal(s): elimination of symptoms of psychosis, medication management for mood stabilization; elimination of SI thoughts; development of comprehensive mental wellness/sobriety plan.   Patient Goals:  "I feel like I already reached my goal, got a clear mind, clear heart, getting closer to God, next working on getting out."  Discharge Plan or Barriers: Pt reports plans to return to his grandmothers.  However, in conversation with his mother patient is not allowed to return there.  CSW will assist patient in developing an appropriate discharge plan.  Reason for Continuation of Hospitalization: Anxiety Depression Hallucinations Medical Issues Medication stabilization  Estimated Length of Stay: 1-7 days  Recreational Therapy: Patient Stressors: N/A Patient Goal: Patient will engage in groups without prompting or encouragement from LRT x3 group sessions within 5 recreation therapy group sessions.  Attendees: Patient: Matthew Munoz  11/14/2020 9:43 AM  Physician: Gabriel Cirri, NP 11/14/2020 9:43 AM  Nursing: Cecille Amsterdam, RN  11/14/2020 9:43 AM  RN Care Manager:  11/14/2020 9:43 AM  Social Worker: Penni Homans, MSW, LCSW  11/14/2020 9:43 AM  Recreational Therapist: Garret Reddish, Drue Flirt, LRT  11/14/2020 9:43 AM  Other: Jillyn Hidden, LCSW  11/14/2020 9:43 AM  Other: Gwenevere Ghazi, MSW, [place], LCASA  11/14/2020 9:43 AM  Other: Kiva Swaziland, MSW, LCSWA  11/14/2020 9:43 AM  Other: Dr. Neale Burly, MD  11/14/2020 9:43 AM    Scribe for Treatment Team: [name], LCSW 11/14/2020 9:43 AM

## 2020-11-14 NOTE — [facility] Group Notes (Signed)
LCSW Group Therapy Note  11/14/2020 1:48 PM  Type of Therapy/Topic:  Group Therapy:  Emotion Regulation  Participation Level:  Active   Description of Group:   The purpose of this group is to assist patients in learning to regulate negative emotions and experience positive emotions. Patients will be guided to discuss ways in which they have been vulnerable to their negative emotions. These vulnerabilities will be juxtaposed with experiences of positive emotions or situations, and patients will be challenged to use positive emotions to combat negative ones. Special emphasis will be placed on coping with negative emotions in conflict situations, and patients will process healthy conflict resolution skills.  Therapeutic Goals: Patient will identify two positive emotions or experiences to reflect on in order to balance out negative emotions Patient will label two or more emotions that they find the most difficult to experience Patient will demonstrate positive conflict resolution skills through discussion and/or role plays  Summary of Patient Progress: Patient was present in group. Patient was attentive and supportive of others in group. Patient shared that to him emotion regulation means to "have a handle on how you feel".  He reports that "anxiety, fear, anger and confusion" were emotions that he felt leading up to his hospitalization.  He reports that the main trigger was him "engaging in things I wouldn't normally".  He reports that he felt like he was in control of his life "from June to December of last year".  He reports that "didn't have a job but was able to get a car and was taking care of things". He reports that since then he has "been kicked out of every place that I have stayed, been disrespectful, felt like I knew everything".  He reports that the factors that allowed him to be successful in the past, and he would like to start engaging in again are "staying calm, being patient and  learning how to brush things off and learning to ignore things".    Therapeutic Modalities:   Cognitive Behavioral Therapy Feelings Identification Dialectical Behavioral Therapy  Penni Homans, MSW, LCSW 11/14/2020 1:48 PM

## 2020-11-14 NOTE — Progress Notes (Signed)
Recreation Therapy Notes    Date: 11/14/2020  Time: 10:30 am   Location: Craft room   Behavioral response: N/A   Intervention Topic: Decision Making    Discussion/Intervention: Patient did not attend group.   Clinical Observations/Feedback:  Patient did not attend group.   Sahas Sluka LRT/CTRS         Sladen Plancarte 11/14/2020 11:17 AM

## 2020-11-14 NOTE — Progress Notes (Signed)
[facility]  11/14/2020 1:50 PM Matthew SnipesDeven D Munoz  MRN:  [id number]  CC: "I can go stay with my grandmother to help her." Subjective:  Matthew RidgelDeven Munoz is a 25 year old male who was brought to the emergency room involuntarily after becoming psychotic in public.  He was walking to his house to get clothes and started running [place] and [place] without any clothing on.  Patient was seen and interviewed today.  Patient has been taking his medications as prescribed.  No unsafe behavior has been noted.  His sleep and appetite are adequate.  Patient continues to deny SI/HI.  He denies auditory or visual hallucinations.  Per nursing note of yesterday, patient seems like he may have been preoccupied internally.  Observation of this today so far.  Patient states there is surely that "I know this will not happen again."  Patient states that he wants to return to his grandmother's house to live and find a job in [place].  He states his grandmother just had surgery and he came help with her.  When I told patient that his mother says he can live with her while he looks for a job, he states "all my contacts for jobs are in [place]."  I told patient I would check with his mother, but it was my understanding that going back to his grandmother's would not be an option at this point.  I did speak with his mother again today and she did reiterate that patient could not go live with his grandmother at this point.  Mother states patient can come live with her, as long as he is stable and looking for a job.  We will make patient aware.   Principal Problem: Undifferentiated schizophrenia (HCC) Diagnosis: Principal Problem:   Undifferentiated schizophrenia (HCC) Active Problems:   Acute psychosis (HCC)  Total Time spent with patient: 20 minutes  Past Psychiatric History: Per mother, patient has had bizarre and delusional behaviors over the last 6 to 8 months.  She denies any abnormal behaviors  prior to that.  [facility], but apparently a note.  Chart notes also indicate a visit on [facility]-01-2020 [facility] where he was IVC by family for bizarre behaviors.  Patient denied any suicidal or homicidal thoughts and was not psychotic at the time and was deemed to be safe for discharge  Past Medical History: History reviewed. No pertinent past medical history. History reviewed. No pertinent surgical history. Family History: History reviewed. No pertinent family history.  Family Psychiatric  History:  Per report of patient's mother: maternal grandmother with debilitating major depressive disorder.  Sister with bipolar disorder with psychotic features. Social History:  Social History   Substance and Sexual Activity  Alcohol Use Never     Social History   Substance and Sexual Activity  Drug Use Never    Social History   Socioeconomic History   Marital status: Single    Spouse name: Not on file   Number of children: Not on file   Years of education: Not on file   Highest education level: Not on file  Occupational History   Not on file  Tobacco Use   Smoking status: Never   Smokeless tobacco: Never  Substance and Sexual Activity   Alcohol use: Never   Drug use: Never   Sexual activity: Not on file  Other Topics Concern   Not on file  Social History Narrative  Not on file   Social Determinants of Health   Financial Resource Strain: Not on file  Food Insecurity: Not on file  Transportation Needs: Not on file  Physical Activity: Not on file  Stress: Not on file  Social Connections: Not on file   Additional Social History:      Sleep: Good  Appetite:  Good  Current Medications: Current Facility-Administered Medications  Medication Dose Route Frequency Provider Last Rate Last Admin   acetaminophen (TYLENOL) tablet 650 mg  650 mg Oral Q6H PRN Novella Olive, NP       alum & mag  hydroxide-simeth (MAALOX/MYLANTA) 200-200-20 MG/5ML suspension 30 mL  30 mL Oral Q4H PRN Novella Olive, NP       ARIPiprazole (ABILIFY) tablet 10 mg  10 mg Oral Daily [name], MD   10 mg at 11/14/20 0805   hydrOXYzine (ATARAX/VISTARIL) tablet 50 mg  50 mg Oral Q6H PRN Clapacs, [name], MD       lisinopril (ZESTRIL) tablet 5 mg  5 mg Oral Daily [name], MD   5 mg at 11/14/20 0654   magnesium hydroxide (MILK OF MAGNESIA) suspension 30 mL  30 mL Oral Daily PRN Novella Olive, NP   30 mL at 11/14/20 1242   OLANZapine zydis (ZYPREXA) disintegrating tablet 10 mg  10 mg Oral Q6H PRN [name], MD       traZODone (DESYREL) tablet 50 mg  50 mg Oral QHS PRN Novella Olive, NP       ziprasidone (GEODON) injection 20 mg  20 mg Intramuscular Q6H PRN [name], MD        Lab Results:  Results for orders placed or performed during the hospital encounter of 11/12/20 (from the past 48 hour(s))  Hemoglobin A1c     Status: None   Collection Time: 11/13/20  6:50 AM  Result Value Ref Range   Hgb A1c MFr Bld 4.8 4.8 - 5.6 %    Comment: (NOTE) Pre diabetes:          5.7%-6.4%  Diabetes:              >6.4%  Glycemic control for   <7.0% adults with diabetes    Mean Plasma Glucose 91.06 mg/dL    Comment: Performed at [facility], [address], [place], [place] [address]  Lipid panel     Status: None   Collection Time: 11/13/20  6:50 AM  Result Value Ref Range   Cholesterol 135 0 - 200 mg/dL   Triglycerides 65 <382 mg/dL   HDL 63 >50 mg/dL   Total CHOL/HDL Ratio 2.1 RATIO   VLDL 13 0 - 40 mg/dL   LDL Cholesterol 59 0 - 99 mg/dL    Comment:        Total Cholesterol/HDL:CHD Risk Coronary Heart Disease Risk Table                     Men   Women  1/2 Average Risk   3.4   3.3  Average Risk       5.0   4.4  2 X Average Risk   9.6   7.1  3 X Average Risk  23.4   11.0        Use the calculated Patient Ratio above and the CHD Risk Table to determine the patient's  CHD Risk.        ATP III CLASSIFICATION (LDL):  <100  mg/dL   Optimal  867-672  mg/dL   Near or Above                    Optimal  130-159  mg/dL   Borderline  094-709  mg/dL   High  >628     mg/dL   Very High Performed at [facility], [address] [place], [place] [address]   TSH     Status: None   Collection Time: 11/13/20  6:50 AM  Result Value Ref Range   TSH 3.474 0.350 - 4.500 uIU/mL    Comment: Performed by a 3rd Generation assay with a functional sensitivity of <=0.01 uIU/mL. Performed at [facility], [address] [place], [place] [address]     Blood Alcohol level:  Lab Results  Component Value Date   [facility] <10 11/11/2020   ETH <10 11/07/2020    Metabolic Disorder Labs: Lab Results  Component Value Date   HGBA1C 4.8 11/13/2020   MPG 91.06 11/13/2020   No results found for: PROLACTIN Lab Results  Component Value Date   CHOL 135 11/13/2020   TRIG 65 11/13/2020   HDL 63 11/13/2020   CHOLHDL 2.1 11/13/2020   VLDL 13 11/13/2020   LDLCALC 59 11/13/2020    Physical Findings: AIMS:  , ,  ,  ,    CIWA:    COWS:     Musculoskeletal: Strength & Muscle Tone: within normal limits Gait & Station: normal Patient leans: N/A  Psychiatric Specialty Exam:  Presentation  General Appearance: Appropriate for Environment  Eye Contact:Good  Speech:Clear and Coherent  Speech Volume:Normal  Handedness:Right   Mood and Affect  Mood:Anxious  Affect:Appropriate   Thought Process  Thought Processes:Goal Directed; Coherent  Descriptions of Associations:Intact  Orientation:Full (Time, Place and Person)  Thought Content:Logical  History of Schizophrenia/Schizoaffective disorder:No  Duration of Psychotic Symptoms:Greater than six months  Hallucinations:Hallucinations: None (Denies) Description of Command Hallucinations: Denies currently  Ideas of Reference:None (Denies)  Suicidal Thoughts:Suicidal Thoughts: No  (Denies)  Homicidal Thoughts:Homicidal Thoughts: No (Denies)   Sensorium  Memory:Immediate Good; Recent Good; Remote Poor  Judgment:Fair  Insight:Poor   Executive Functions  Concentration:Good  Attention Span:Good  Recall:Good  Fund of Knowledge:Good  Language:Good   Psychomotor Activity  Psychomotor Activity:Psychomotor Activity: Normal   Assets  Assets:Communication Skills; Housing; Leisure Time; Physical Health; Resilience; Social Support; Talents/Skills; Vocational/Educational   Sleep  Sleep:Sleep: Good Number of Hours of Sleep: 8    Physical Exam: Physical Exam Vitals (In no acute distress) and nursing note reviewed.  HENT:     Head: Normocephalic.     Nose: No congestion or rhinorrhea.  Eyes:     General:        Right eye: No discharge.        Left eye: No discharge.  Cardiovascular:     Rate and Rhythm: Normal rate.  Pulmonary:     Effort: Pulmonary effort is normal.  Musculoskeletal:        General: Normal range of motion.     Cervical back: Normal range of motion.  Skin:    General: Skin is warm and dry.  Neurological:     Mental Status: He is alert and oriented to person, place, and time.  Psychiatric:        Mood and Affect: Mood normal.        Speech: Speech normal.        Behavior: Behavior normal.  Thought Content: Thought content normal.        Cognition and Memory: Cognition normal.        Judgment: Judgment normal.     Comments: Brought to the hospital for psychotic behavior.  Denies at this time   Review of Systems  Psychiatric/Behavioral:  Positive for hallucinations (History of, denies at this time). Negative for depression, memory loss, substance abuse and suicidal ideas. The patient is nervous/anxious. The patient does not have insomnia.   All other systems reviewed and are negative. Blood pressure (!) 142/94, pulse 62, temperature 97.7 F (36.5 C), temperature source Oral, resp. rate 17, height 6\' 4"  (1.93 m),  weight 77.1 kg, SpO2 100 %. Body mass index is 20.69 kg/m.   Treatment Plan Summary: Daily contact with patient to assess and evaluate symptoms and progress in treatment and Medication management 11/14/2020 update: Patient is remained calm and cooperative.  He is taking medications as prescribed.  Patient will need to agree to safe discharge disposition and adequate follow-up.   Psychosis - Continue Abilify 10 mg p.o. daily  Agitation - Continue Zyprexa 10 mg disintegrating tablet p.o. every 6 hours as needed - Continue Geodon 20 mg IM every 6 hours as needed  Insomnia - Continue trazodone 50 mg oral at bedtime as needed  Anxiety - Continue hydroxyzine 50 mg oral every 6 hours as needed  Mitral valve regurgitation/hypertension - Continue lisinopril 5 mg p.o. daily   PRN, Other --Continue Tylenol 650 mg po every 6 hrs prn pain --Continue MAALOX/MYLANTA 30 mL po every 4 hrs prn indigestion --Continue Milk of Magnesia 30 mL po daily prn constipation      01/14/2021, NP 11/14/2020, 1:50 PM

## 2020-11-14 NOTE — Plan of Care (Signed)
Patient pleasant and cooperative on approach. Patient rated his depression and anxiety 0/10 and his goal for today is " preparing to leave." Patient asked questions about his medications before taking it. Denies SI,HI and AVH. Appetite and energy level good.In and out of his room.ADLs maintained. Support and encouragement given.

## 2020-11-15 ENCOUNTER — Other Ambulatory Visit: Payer: Self-pay

## 2020-11-15 DIAGNOSIS — F203 Undifferentiated schizophrenia: Secondary | ICD-10-CM | POA: Diagnosis not present

## 2020-11-15 MED ORDER — LISINOPRIL 5 MG PO TABS
5.0000 mg | ORAL_TABLET | Freq: Every day | ORAL | 0 refills | Status: DC
  Filled 2020-11-15: qty 7, 7d supply, fill #0

## 2020-11-15 MED ORDER — TRAZODONE HCL 50 MG PO TABS
50.0000 mg | ORAL_TABLET | Freq: Every evening | ORAL | 0 refills | Status: DC | PRN
  Filled 2020-11-15: qty 7, 7d supply, fill #0

## 2020-11-15 MED ORDER — ARIPIPRAZOLE 10 MG PO TABS
10.0000 mg | ORAL_TABLET | Freq: Every day | ORAL | 0 refills | Status: DC
  Filled 2020-11-15: qty 7, 7d supply, fill #0

## 2020-11-15 NOTE — Progress Notes (Signed)
Patient pleasant and cooperative. Denies SI, HI, AVH. No psychosis noted. Visible in milieu, socializing with staff and peers. Voiced no complaints or concerns. Slept well, no sleep med requested. Encouragement and support provided. Safety checks maintained. Medications given as prescribed. Pt receptive and remains safe on unit with q 15 min checks.

## 2020-11-15 NOTE — Progress Notes (Signed)
D: Pt alert and oriented. Pt denies experiencing any anxiety/depression at this time. Pt denies experiencing any pain at this time. Pt denies experiencing any SI/HI, or AVH at this time.   A: Scheduled medications administered to pt, per MD orders. Support and encouragement provided. Frequent verbal contact made. Routine safety checks conducted q15 minutes.   R: No adverse drug reactions noted. Pt verbally contracts for safety at this time. Pt complaint with medications. Pt interacts well with others on the unit. Pt remains safe at this time. Will continue to monitor.  

## 2020-11-15 NOTE — Plan of Care (Signed)
  Problem: Education: Goal: Knowledge of [facility] Education information/materials will improve Outcome: Progressing Goal: Emotional status will improve Outcome: Progressing Goal: Mental status will improve Outcome: Progressing Goal: Verbalization of understanding the information provided will improve Outcome: Progressing   Problem: Activity: Goal: Interest or engagement in activities will improve Outcome: Progressing   

## 2020-11-15 NOTE — Progress Notes (Signed)
I called mom regarding discharge tomorrow and appointment with Lorella Nimrod at [facility] Monday, 8/15. She said patient did let her know about discharge. Lorella Nimrod from [facility] will contact mom. She expressed understanding and is comfortable with  patient discharging to her home.

## 2020-11-15 NOTE — [facility] Group Notes (Signed)
LCSW Group Therapy Note  11/15/2020 1:49 PM  Type of Therapy/Topic:  Group Therapy:  Balance in Life  Participation Level:  Active  Description of Group:    This group will address the concept of balance and how it feels and looks when one is unbalanced. Patients will be encouraged to process areas in their lives that are out of balance and identify reasons for remaining unbalanced. Facilitators will guide patients in utilizing problem-solving interventions to address and correct the stressor making their life unbalanced. Understanding and applying boundaries will be explored and addressed for obtaining and maintaining a balanced life. Patients will be encouraged to explore ways to assertively make their unbalanced needs known to significant others in their lives, using other group members and facilitator for support and feedback.  Therapeutic Goals: Patient will identify two or more emotions or situations they have that consume much of in their lives. Patient will identify signs/triggers that life has become out of balance:  Patient will identify two ways to set boundaries in order to achieve balance in their lives:  Patient will demonstrate ability to communicate their needs through discussion and/or role plays  Summary of Patient Progress: Patient was present for the entirety of the group. He spoke about things which cause him to become off balanced and reconnecting with old activities like writing poetry and playing basketball. He was actively involved in the conversations and his feedback/comments were pertinent. Healthy relationships, communication, and setting boundaries also discussed briefly in context of things that make him happy. Pt identified crosswords and chess as new activities that he would like to connect with to help maintain balance.   Therapeutic Modalities:   Cognitive Behavioral Therapy Solution-Focused Therapy Assertiveness Training  [employer]. Algis Greenhouse, MSW, LCSW,  LCAS 11/15/2020 1:49 PM

## 2020-11-15 NOTE — Progress Notes (Addendum)
[facility] MD Progress Note  11/15/2020 10:02 AM KAM RAHIMI  MRN:  [id number]  CC: "I talked to my mother.  I agree to go live with her and start services with [facility]." Subjective:  Matthew Munoz is a 25 year old male who was brought to the emergency room involuntarily after becoming psychotic in public.  He was walking to his house to get clothes and started running through the [place] and [place] without any clothing on.  Patient was seen and interviewed today.  Patient has remained calm, pleasant, cooperative during the entirety of his stay thus far.  He has not required any as needed's for agitation or anxiety.  He continues to deny SI/HI/AVH.  He has not been observed appearing to be responding to internal stimuli.  He is attending milieu activities, groups and interacting with staff and peers appropriately.  He reports adequate sleep and appetite.  Patient agrees that his behavior that brought him to the hospital was psychotic.  Discussion regarding this and the need to continue on his medication to help prevent any further episodes, and that he would be able to live a full and productive life.  Patient agrees that this is what he is going to do because "I do not want to run around naked anymore," he says with a smile.  Discussed the advantages of getting a long-acting injectable form of the medication monthly.  Patient immediately states "no, I do not like shots.  I will take the pill every day."  Advised him that it is available if he changes his mind.  Discussed the importance of continued follow-up.  Patient has spoken with Lorella Nimrod, representative from [facility].  Lorella Nimrod will pick patient up on Monday, 8/15 for intake appointment.  Patient will be discharged tomorrow, to his mother in [place].  Left HIPAA compliant message at mother's number John Giovanni, [phone], for her to return call regarding update.  When I spoke to mother yesterday, it was agreed that he would be discharged to her.  7  days of medication was prescribed to employee pharmacy today so that it would be ready for patient when he discharges.  Principal Problem: Undifferentiated schizophrenia (HCC) Diagnosis: Principal Problem:   Undifferentiated schizophrenia (HCC) Active Problems:   Acute psychosis (HCC)  Total Time spent with patient: 20 minutes  Past Psychiatric History: Per mother, patient has had bizarre and delusional behaviors over the last 6 to 8 months.  She denies any abnormal behaviors prior to that.  Patient was seen at [facility] for psychosis, but apparently a note.  Chart notes also indicate a visit on 09-01-2020 at [facility] behavioral health care where he was IVC by family for bizarre behaviors.  Patient denied any suicidal or homicidal thoughts and was not psychotic at the time and was deemed to be safe for discharge  Past Medical History: History reviewed. No pertinent past medical history. History reviewed. No pertinent surgical history. Family History: History reviewed. No pertinent family history.  Family Psychiatric  History:  Per report of patient's mother: maternal grandmother with debilitating major depressive disorder.  Sister with bipolar disorder with psychotic features. Social History:  Social History   Substance and Sexual Activity  Alcohol Use Never     Social History   Substance and Sexual Activity  Drug Use Never    Social History   Socioeconomic History   Marital status: Single    Spouse name: Not on file   Number of children: Not on file  Years of education: Not on file   Highest education level: Not on file  Occupational History   Not on file  Tobacco Use   Smoking status: Never   Smokeless tobacco: Never  Substance and Sexual Activity   Alcohol use: Never   Drug use: Never   Sexual activity: Not on file  Other Topics Concern   Not on file  Social History Narrative   Not on file   Social Determinants of Health   Financial  Resource Strain: Not on file  Food Insecurity: Not on file  Transportation Needs: Not on file  Physical Activity: Not on file  Stress: Not on file  Social Connections: Not on file   Additional Social History:      Sleep: Good  Appetite:  Good  Current Medications: Current Facility-Administered Medications  Medication Dose Route Frequency Provider Last Rate Last Admin   acetaminophen (TYLENOL) tablet 650 mg  650 mg Oral Q6H PRN Novella Olive, NP       alum & mag hydroxide-simeth (MAALOX/MYLANTA) 200-200-20 MG/5ML suspension 30 mL  30 mL Oral Q4H PRN Novella Olive, NP       ARIPiprazole (ABILIFY) tablet 10 mg  10 mg Oral Daily [name], MD   10 mg at 11/15/20 4818   hydrOXYzine (ATARAX/VISTARIL) tablet 50 mg  50 mg Oral Q6H PRN Clapacs, John T, MD       lisinopril (ZESTRIL) tablet 5 mg  5 mg Oral Daily [name], MD   5 mg at 11/15/20 5631   magnesium hydroxide (MILK OF MAGNESIA) suspension 30 mL  30 mL Oral Daily PRN Novella Olive, NP   30 mL at 11/14/20 1242   OLANZapine zydis (ZYPREXA) disintegrating tablet 10 mg  10 mg Oral Q6H PRN [name], MD       traZODone (DESYREL) tablet 50 mg  50 mg Oral QHS PRN Novella Olive, NP       ziprasidone (GEODON) injection 20 mg  20 mg Intramuscular Q6H PRN [name], MD        Lab Results:  No results found for this or any previous visit (from the past 48 hour(s)).   Blood Alcohol level:  Lab Results  Component Value Date   ETH <10 11/11/2020   ETH <10 11/07/2020    Metabolic Disorder Labs: Lab Results  Component Value Date   HGBA1C 4.8 11/13/2020   MPG 91.06 11/13/2020   No results found for: PROLACTIN Lab Results  Component Value Date   CHOL 135 11/13/2020   TRIG 65 11/13/2020   HDL 63 11/13/2020   CHOLHDL 2.1 11/13/2020   VLDL 13 11/13/2020   LDLCALC 59 11/13/2020    Physical Findings: AIMS:  , ,  ,  ,    CIWA:    COWS:     Musculoskeletal: Strength & Muscle Tone: within normal  limits Gait & Station: normal Patient leans: N/A  Psychiatric Specialty Exam:  Presentation  General Appearance: Appropriate for Environment  Eye Contact:Good  Speech:Clear and Coherent  Speech Volume:Normal  Handedness:Right   Mood and Affect  Mood:Euthymic  Affect:Appropriate; Congruent   Thought Process  Thought Processes:Coherent  Descriptions of Associations:Intact  Orientation:Full (Time, Place and Person)  Thought Content:Logical; WDL  History of Schizophrenia/Schizoaffective disorder:No  Duration of Psychotic Symptoms:Greater than six months  Hallucinations:Hallucinations: None (Denies, none observed)  Ideas of Reference:None (Denies at this time, none observed)  Suicidal Thoughts:Suicidal Thoughts: No (Denies)  Homicidal Thoughts:Homicidal Thoughts: No (  Denies)   Sensorium  Memory:Immediate Good; Recent Good; Remote Good  Judgment:Good (During this-hospital stay)  Insight:Good   Executive Functions  Concentration:Good  Attention Span:Good  Recall:Good  Fund of Knowledge:Good  Language:Good   Psychomotor Activity  Psychomotor Activity:Psychomotor Activity: Normal   Assets  Assets:Communication Skills; Housing; Leisure Time; Physical Health; Resilience; Social Support; Talents/Skills; Vocational/Educational   Sleep  Sleep:Sleep: Good Number of Hours of Sleep: 8    Physical Exam: Physical Exam Vitals (In no acute distress) and nursing note reviewed.  HENT:     Head: Normocephalic.     Nose: No congestion or rhinorrhea.  Eyes:     General:        Right eye: No discharge.        Left eye: No discharge.  Cardiovascular:     Rate and Rhythm: Normal rate.  Pulmonary:     Effort: Pulmonary effort is normal.  Musculoskeletal:        General: Normal range of motion.     Cervical back: Normal range of motion.  Skin:    General: Skin is warm and dry.  Neurological:     Mental Status: He is alert and oriented to person,  place, and time.  Psychiatric:        Mood and Affect: Mood normal.        Speech: Speech normal.        Behavior: Behavior normal.        Thought Content: Thought content normal.        Cognition and Memory: Cognition normal.        Judgment: Judgment normal.     Comments: Brought to the hospital for psychotic behavior.  Patient has been taking antipsychotic.  No further episodes noted   Review of Systems  Psychiatric/Behavioral:  Positive for hallucinations (History of, denies at this time). Negative for depression, memory loss, substance abuse and suicidal ideas. The patient is nervous/anxious. The patient does not have insomnia.   All other systems reviewed and are negative. Blood pressure (!) 136/98, pulse 80, temperature 97.7 F (36.5 C), temperature source Oral, resp. rate 17, height 6\' 4"  (1.93 m), weight 77.1 kg, SpO2 100 %. Body mass index is 20.69 kg/m.   Treatment Plan Summary: Daily contact with patient to assess and evaluate symptoms and progress in treatment and Medication management  11/15/2020 update: Patient has remained calm and cooperative and without any observation of psychotic behaviors throughout this admission.  Continues to deny auditory or visual hallucinations, suicidal or homicidal ideations.  He admits that his thoughts were psychotic and agrees that the medication will help the thoughts from coming back.  After discussion of the benefits of LAI, patient declines, stating that he will continue to take the oral medication.  He is taking medications as prescribed.  He has not needed any as needed's for agitation or anxiety.  Patient has agreed to live with mom in [place] and will [facility] 11/19/2020.  Patient to discharge tomorrow, 11/16/2020 mother is in agreement with this plan.   Psychosis - Continue Abilify 10 mg p.o. daily  Agitation - Continue Zyprexa 10 mg disintegrating tablet p.o. every 6 hours as needed - Continue Geodon 20 mg  IM every 6 hours as needed  Insomnia - Continue trazodone 50 mg oral at bedtime as needed  Anxiety - Continue hydroxyzine 50 mg oral every 6 hours as needed  Mitral valve regurgitation/hypertension - Continue lisinopril 5 mg p.o. daily   PRN,  Other --Continue Tylenol 650 mg po every 6 hrs prn pain --Continue MAALOX/MYLANTA 30 mL po every 4 hrs prn indigestion --Continue Milk of Magnesia 30 mL po daily prn constipation      Vanetta Mulders, NP 11/15/2020, 10:02 AM

## 2020-11-15 NOTE — Progress Notes (Signed)
Patient calm and compliant during assessment, denies SI/HI/AVH. Patient stated he had a good day. Pt observed interacting appropriately with staff and peers on the unit. Patient didn't have any medication scheduled tonight and hasn't requested anything PRN. Pt given education, support, and encouragement to be active in his treatment plan. Pt being monitored Q 15 minutes for safety per unit protocol. Pt remains safe on the unit  

## 2020-11-15 NOTE — Progress Notes (Signed)
Recreation Therapy Notes   Date: 11/15/2020  Time: 10:15 am   Location: Courtyard   Behavioral response: Appropriate  Intervention Topic: Leisure   Discussion/Intervention:  Group content today was focused on leisure. The group defined what leisure is and some positive leisure activities they participate in. Individuals identified the difference between good and bad leisure. Participants expressed how they feel after participating in the leisure of their choice. The group discussed how they go about picking a leisure activity and if others are involved in their leisure activities. The patient stated how many leisure activities they have to choose from and reasons why it is important to have leisure time. Individuals participated in the intervention "Exploration of Leisure" where they had a chance to identify new leisure activities as well as benefits of leisure. Clinical Observations/Feedback: Patient came to group and was social with Clinical research associate and peers while participating in the intervention.   Disney Ruggiero LRT/CTRS         Skyelyn Scruggs 11/15/2020 11:40 AM

## 2020-11-15 NOTE — Plan of Care (Signed)
Patient presents at his baseline, scheduled to D/C tomorrow  Problem: Education: Goal: Emotional status will improve Outcome: Progressing Goal: Mental status will improve Outcome: Progressing

## 2020-11-16 MED ORDER — LISINOPRIL 5 MG PO TABS: 5.0000 mg | ORAL_TABLET | Freq: Every day | ORAL | 1 refills | Status: AC

## 2020-11-16 MED ORDER — ARIPIPRAZOLE 10 MG PO TABS: 10.0000 mg | ORAL_TABLET | Freq: Every day | ORAL | 1 refills | Status: AC

## 2020-11-16 MED ORDER — TRAZODONE HCL 50 MG PO TABS: 50.0000 mg | ORAL_TABLET | Freq: Every evening | ORAL | 1 refills | Status: AC | PRN

## 2020-11-16 NOTE — Discharge Summary (Signed)
Physician Discharge Summary Note  Patient:  Matthew Munoz is an 25 y.o., male MRN:  [id number] DOB:  [birth year] Patient phone:  [phone] (home)  Patient address:   [address] [place] [address],  Total Time spent with patient: 35 minutes- 25 minutes face-to-face contact with patient, 10 minutes documentation, coordination of care, scripts   Date of Admission:  11/12/2020 Date of Discharge: 11/16/20  Reason for Admission:   Matthew Munoz is a 25 year old male who was brought to the emergency room involuntarily after becoming psychotic in public.  He was walking to his house to get clothes and started running through the woods and [place] without any clothing on.  Principal Problem: Undifferentiated schizophrenia [facility]) Discharge Diagnoses: Principal Problem:   Undifferentiated schizophrenia (HCC) Active Problems:   Acute psychosis (HCC)   Past Psychiatric History: Per mother, patient has had bizarre and delusional behaviors over the last 6 to 8 months.  She denies any abnormal behaviors prior to that.  Patient was seen at [facility] for psychosis, but apparently a note.  Chart notes also indicate a visit on 09-01-2020 at [facility] behavioral health care where he was IVC by family for bizarre behaviors.  Patient denied any suicidal or homicidal thoughts and was not psychotic at the time and was deemed to be safe for discharge.  Past Medical History: History reviewed. No pertinent past medical history. History reviewed. No pertinent surgical history. Family History: History reviewed. No pertinent family history. Family Psychiatric  History: Per report of patient's mother: maternal grandmother with debilitating major depressive disorder.  Sister with bipolar disorder with psychotic features. Social History:  Social History   Substance and Sexual Activity  Alcohol Use Never     Social History   Substance and Sexual Activity  Drug Use Never     Social History   Socioeconomic History   Marital status: Single    Spouse name: Not on file   Number of children: Not on file   Years of education: Not on file   Highest education level: Not on file  Occupational History   Not on file  Tobacco Use   Smoking status: Never   Smokeless tobacco: Never  Substance and Sexual Activity   Alcohol use: Never   Drug use: Never   Sexual activity: Not on file  Other Topics Concern   Not on file  Social History Narrative   Not on file   Social Determinants of Health   Financial Resource Strain: Not on file  Food Insecurity: Not on file  Transportation Needs: Not on file  Physical Activity: Not on file  Stress: Not on file  Social Connections: Not on file    Hospital Course: 25 year old male who presents for acute psychosis.  While on the unit he was started on Abilify and titrated to 10 mg daily with good effect.  He was also placed on lisinopril 5 mg daily for hypertension and history of mitral valve regurgitation.  While here on the unit he exhibited no aggression or behavioral issues.  He did not require restraints or seclusion.  He denies suicidal ideations, homicidal ideations, visual hallucinations, auditory hallucinations.  He plans to live with his mom at discharge and attend [facility] for continued mental health treatment.  He was offered Abilify long-acting injectable, but patient declined.  He is agreeable to continuing oral Abilify at discharge.  Physical Findings: AIMS: Facial and Oral Movements Muscles of Facial Expression: None, normal Lips and Perioral  Area: None, normal Jaw: None, normal Tongue: None, normal,Extremity Movements Upper (arms, wrists, hands, fingers): None, normal Lower (legs, knees, ankles, toes): None, normal, Trunk Movements Neck, shoulders, hips: None, normal, Overall Severity Severity of abnormal movements (highest score from questions above): None, normal Incapacitation due to abnormal movements:  None, normal Patient's awareness of abnormal movements (rate only patient's report): No Awareness, Dental Status Current problems with teeth and/or dentures?: No Does patient usually wear dentures?: No  CIWA:    COWS:     Musculoskeletal: Strength & Muscle Tone: within normal limits Gait & Station: normal Patient leans: N/A   Psychiatric Specialty Exam:  Presentation  General Appearance: Appropriate for Environment  Eye Contact:Good  Speech:Clear and Coherent  Speech Volume:Normal  Handedness:Right   Mood and Affect  Mood:Euthymic  Affect:Congruent   Thought Process  Thought Processes:Coherent; Goal Directed  Descriptions of Associations:Intact  Orientation:Full (Time, Place and Person)  Thought Content:Logical  History of Schizophrenia/Schizoaffective disorder:No  Duration of Psychotic Symptoms:Greater than six months  Hallucinations:Hallucinations: None  Ideas of Reference:None  Suicidal Thoughts:Suicidal Thoughts: No  Homicidal Thoughts:Homicidal Thoughts: No   Sensorium  Memory:Immediate Good; Recent Good; Remote Good  Judgment:Good  Insight:Good   Executive Functions  Concentration:Good  Attention Span:Good  Recall:Good  Fund of Knowledge:Good  Language:Good   Psychomotor Activity  Psychomotor Activity:Psychomotor Activity: Normal   Assets  Assets:Communication Skills; Desire for Improvement; Financial Resources/Insurance; Housing; Physical Health; Resilience; Social Support; Talents/Skills; Vocational/Educational   Sleep  Sleep:Sleep: Good Number of Hours of Sleep: 8.25    Physical Exam: Physical Exam Vitals and nursing note reviewed.  Constitutional:      Appearance: Normal appearance.  HENT:     Head: Normocephalic and atraumatic.     Right Ear: External ear normal.     Left Ear: External ear normal.     Nose: Nose normal.     Mouth/Throat:     Mouth: Mucous membranes are moist.     Pharynx: Oropharynx is  clear.  Eyes:     Extraocular Movements: Extraocular movements intact.     Conjunctiva/sclera: Conjunctivae normal.     Pupils: Pupils are equal, round, and reactive to light.  Cardiovascular:     Rate and Rhythm: Normal rate.     Pulses: Normal pulses.  Pulmonary:     Effort: Pulmonary effort is normal.     Breath sounds: Normal breath sounds.  Abdominal:     General: Abdomen is flat.     Palpations: Abdomen is soft.  Musculoskeletal:        General: No swelling. Normal range of motion.     Cervical back: Normal range of motion and neck supple.  Skin:    General: Skin is warm and dry.  Neurological:     General: No focal deficit present.     Mental Status: He is alert and oriented to person, place, and time.  Psychiatric:        Mood and Affect: Mood normal.        Behavior: Behavior normal.        Thought Content: Thought content normal.        Judgment: Judgment normal.   Review of Systems  Constitutional: Negative.   HENT: Negative.    Eyes: Negative.   Respiratory: Negative.    Cardiovascular: Negative.   Gastrointestinal: Negative.   Genitourinary: Negative.   Musculoskeletal: Negative.   Skin: Negative.   Neurological: Negative.   Endo/Heme/Allergies:  Positive for environmental allergies. Does not bruise/bleed easily.  Psychiatric/Behavioral:  Negative for depression, hallucinations, memory loss, substance abuse and suicidal ideas. The patient is not nervous/anxious and does not have insomnia.   Blood pressure 125/86, pulse (!) 59, temperature 97.7 F (36.5 C), temperature source Oral, resp. rate 17, height 6\' 4"  (1.93 m), weight 77.1 kg, SpO2 100 %. Body mass index is 20.69 kg/m.   Social History   Tobacco Use  Smoking Status Never  Smokeless Tobacco Never   Tobacco Cessation:  N/A, patient does not currently use tobacco products   Blood Alcohol level:  Lab Results  Component Value Date   ETH <10 11/11/2020   ETH <10 11/07/2020    Metabolic  Disorder Labs:  Lab Results  Component Value Date   HGBA1C 4.8 11/13/2020   MPG 91.06 11/13/2020   No results found for: PROLACTIN Lab Results  Component Value Date   CHOL 135 11/13/2020   TRIG 65 11/13/2020   HDL 63 11/13/2020   CHOLHDL 2.1 11/13/2020   VLDL 13 11/13/2020   LDLCALC 59 11/13/2020    See Psychiatric Specialty Exam and Suicide Risk Assessment completed by Attending Physician prior to discharge.  Discharge destination:  Home  Is patient on multiple antipsychotic therapies at discharge:  No   Has Patient had three or more failed trials of antipsychotic monotherapy by history:  No  Recommended Plan for Multiple Antipsychotic Therapies: NA  Discharge Instructions     Diet general   Complete by: As directed    Increase activity slowly   Complete by: As directed       Allergies as of 11/16/2020       Reactions   Avocado Swelling        Medication List     TAKE these medications      Indication  ARIPiprazole 10 MG tablet Commonly known as: ABILIFY Take 1 tablet (10 mg total) by mouth daily.  Indication: Schizophrenia   lisinopril 5 MG tablet Commonly known as: ZESTRIL Take 1 tablet (5 mg total) by mouth daily.  Indication: reported Mitral Valve regurgitation/episodes of HTN   traZODone 50 MG tablet Commonly known as: DESYREL Take 1 tablet (50 mg total) by mouth at bedtime as needed for sleep.  Indication: Trouble Sleeping         Follow-up recommendations:  Activity:  as tolerated Diet:  regular diet  Comments:  7-day supply of free medications provided to patient. 30-day scripts with 1 refill sent to [facility] on [facility] in [place], [place] per request.   Signed: [place], MD 11/16/2020, 9:23 AM

## 2020-11-16 NOTE — [facility] Suicide Risk Assessment (Signed)
[facility] Discharge Suicide Risk Assessment   Principal Problem: Undifferentiated schizophrenia [facility]) Discharge Diagnoses: Principal Problem:   Undifferentiated schizophrenia (HCC) Active Problems:   Acute psychosis (HCC)   Total Time spent with patient: 35 minutes- 25 minutes face-to-face contact with patient, 10 minutes documentation, coordination of care, scripts   Musculoskeletal: Strength & Muscle Tone: within normal limits Gait & Station: normal Patient leans: N/A  Psychiatric Specialty Exam  Presentation  General Appearance: Appropriate for Environment  Eye Contact:Good  Speech:Clear and Coherent  Speech Volume:Normal  Handedness:Right   Mood and Affect  Mood:Euthymic  Duration of Depression Symptoms: No data recorded Affect:Congruent   Thought Process  Thought Processes:Coherent; Goal Directed  Descriptions of Associations:Intact  Orientation:Full (Time, Place and Person)  Thought Content:Logical  History of Schizophrenia/Schizoaffective disorder:No  Duration of Psychotic Symptoms:Greater than six months  Hallucinations:Hallucinations: None  Ideas of Reference:None  Suicidal Thoughts:Suicidal Thoughts: No  Homicidal Thoughts:Homicidal Thoughts: No   Sensorium  Memory:Immediate Good; Recent Good; Remote Good  Judgment:Good  Insight:Good   Executive Functions  Concentration:Good  Attention Span:Good  Recall:Good  Fund of Knowledge:Good  Language:Good   Psychomotor Activity  Psychomotor Activity:Psychomotor Activity: Normal   Assets  Assets:Communication Skills; Desire for Improvement; Financial Resources/Insurance; Housing; Physical Health; Resilience; Social Support; Talents/Skills; Vocational/Educational   Sleep  Sleep:Sleep: Good Number of Hours of Sleep: 8.25   Physical Exam: Physical Exam ROS Blood pressure 125/86, pulse (!) 59, temperature 97.7 F (36.5 C), temperature source Oral, resp. rate 17, height 6\' 4"   (1.93 m), weight 77.1 kg, SpO2 100 %. Body mass index is 20.69 kg/m.  Mental Status Per Nursing Assessment::   On Admission:  NA  Demographic Factors:  Male and Adolescent or young adult  Loss Factors: NA  Historical Factors: Family history of mental illness or substance abuse  Risk Reduction Factors:   Sense of responsibility to family, Religious beliefs about death, Living with another person, especially a relative, Positive social support, Positive therapeutic relationship, and Positive coping skills or problem solving skills  Continued Clinical Symptoms:  Schizophrenia:   Paranoid or undifferentiated type  Cognitive Features That Contribute To Risk:  None    Suicide Risk:  Minimal: No identifiable suicidal ideation.  Patients presenting with no risk factors but with morbid ruminations; may be classified as minimal risk based on the severity of the depressive symptoms    Plan Of Care/Follow-up recommendations:  Activity:  as tolerated Diet:  regular diet  [phone], MD 11/16/2020, 9:19 AM

## 2020-11-16 NOTE — Progress Notes (Signed)
Recreation Therapy Notes  INPATIENT RECREATION TR PLAN  Patient Details Name: Matthew Munoz MRN: [id number] DOB: 07-21-1995 Today's Date: 11/16/2020  Rec Therapy Plan Is patient appropriate for Therapeutic Recreation?: Yes Treatment times per week: at least 3 Estimated Length of Stay: 5-7 days TR Treatment/Interventions: Group participation (Comment)  Discharge Criteria Pt will be discharged from therapy if:: Discharged Treatment plan/goals/alternatives discussed and agreed upon by:: Patient/family  Discharge Summary Short term goals set: Patient will successfully identify 2 ways of making healthy decisions post d/c within 5 recreation therapy group sessions Short term goals met: Adequate for discharge Progress toward goals comments: Groups attended Which groups?: Leisure education, Goal setting Reason goals not met: N/A Therapeutic equipment acquired: N/A Reason patient discharged from therapy: Discharge from hospital Pt/family agrees with progress & goals achieved: Yes Date patient discharged from therapy: 11/16/20   Jaaliyah Lucatero 11/16/2020, 12:20 PM

## 2020-11-16 NOTE — Progress Notes (Signed)
Recreation Therapy Notes  Date: 11/16/2020  Time: 11:00 am   Location: Craft room   Behavioral response: N/A   Intervention Topic: Values    Discussion/Intervention: Patient did not attend group.   Clinical Observations/Feedback:  Patient did not attend group.   Janasia Coverdale LRT/CTRS        Veeda Virgo 11/16/2020 11:32 AM

## 2020-11-16 NOTE — Plan of Care (Signed)
  Problem: Decision Making Goal: STG - Patient will successfully identify 2 ways of making healthy decisions post d/c within 5 recreation therapy group sessions Description: STG - Patient will successfully identify 2 ways of making healthy decisions post d/c within 5 recreation therapy group sessions 11/16/2020 1219 by Alveria Apley, LRT Outcome: Adequate for Discharge 11/16/2020 1219 by Alveria Apley, LRT Outcome: Adequate for Discharge

## 2020-11-16 NOTE — Progress Notes (Signed)
Patient was educated on medications, prescriptions, and follow up care. Patient questions were answered and patient verbalized understanding and did not voice any concerns. Patient's belongings were returned. Pt was safely discharged to the [facility]. Patient was not observed to be in any distress at time of discharge.

## 2020-11-16 NOTE — Progress Notes (Addendum)
  [facility] Adult Case Management Discharge Plan :  Will you be returning to the same living situation after discharge:  No.  Patient going to his mothers home.  At discharge, do you have transportation home?: Yes,  pt report that his mother will provide transportation. Do you have the ability to pay for your medications: No.  Release of information consent forms completed and in the chart;  Patient's signature needed at discharge.  Patient to Follow up at:  Follow-up Information     [facility] Follow up.   Why: Follow up with Lorella Nimrod 11/19/2020 at 7AM.  Thanks! Contact information: [address] [place] [address] [phone]                 Next level of care provider has access to [facility] Link:no  Safety Planning and Suicide Prevention discussed: Yes,  SPE completed with the patient.      Has patient been referred to the Quitline?: Patient refused referral  Patient has been referred for addiction treatment: N/A  [name], LCSW 11/16/2020, 9:39 AM

## 2020-11-16 NOTE — Progress Notes (Signed)
Patient is calm and cooperative with assessment. He brightens on approach and affect is appropriate to circumstance. Patient denies suicidal ideations, homicidal ideations, and auditory and visual hallucinations. He states the last time he had hallucinations was the day he went to [facility]. Patient is happy about discharge today and says he will probably be going home to his mom's for a couple of days. Patient says that he is going to look for a job when he leaves the hospital and that he wants to do something with driving. Patient is compliant with scheduled medications. He is observed to be interacting appropriately with staff and other patients on the unit. Patient remains safe on the unit at this time.

## 2020-12-19 ENCOUNTER — Ambulatory Visit (HOSPITAL_COMMUNITY): Payer: 59 | Admitting: Licensed Clinical Social Worker

## 2021-08-31 ENCOUNTER — Emergency Department: Payer: Self-pay

## 2021-08-31 ENCOUNTER — Emergency Department
Admission: EM | Admit: 2021-08-31 | Discharge: 2021-08-31 | Disposition: A | Discharge status: Home / Self Care | Payer: Self-pay | Attending: Emergency Medicine | Admitting: Emergency Medicine

## 2021-08-31 ENCOUNTER — Other Ambulatory Visit: Payer: Self-pay

## 2021-08-31 DIAGNOSIS — Y92410 Unspecified street and highway as the place of occurrence of the external cause: Secondary | ICD-10-CM | POA: Insufficient documentation

## 2021-08-31 DIAGNOSIS — S40011A Contusion of right shoulder, initial encounter: Secondary | ICD-10-CM

## 2021-08-31 DIAGNOSIS — S4991XA Unspecified injury of right shoulder and upper arm, initial encounter: Secondary | ICD-10-CM | POA: Diagnosis present

## 2021-08-31 MED ORDER — METHOCARBAMOL 500 MG PO TABS
1000.0000 mg | ORAL_TABLET | Freq: Once | ORAL | Status: AC
  Administered 2021-08-31: 1000 mg via ORAL
  Filled 2021-08-31: qty 2

## 2021-08-31 MED ORDER — METHOCARBAMOL 500 MG PO TABS: 500.0000 mg | ORAL_TABLET | Freq: Four times a day (QID) | ORAL | 0 refills | Status: AC

## 2021-08-31 MED ORDER — MELOXICAM 7.5 MG PO TABS
15.0000 mg | ORAL_TABLET | Freq: Once | ORAL | Status: AC
  Administered 2021-08-31: 15 mg via ORAL
  Filled 2021-08-31: qty 2

## 2021-08-31 MED ORDER — MELOXICAM 15 MG PO TABS: 15.0000 mg | ORAL_TABLET | Freq: Every day | ORAL | 0 refills | Status: AC
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
